# Patient Record
Sex: Female | Born: 1950
Health system: Southern US, Community
[De-identification: ages and names within clinical notes are randomized; demographics above are authoritative.]

## PROBLEM LIST (undated history)

## (undated) DIAGNOSIS — Z8711 Personal history of peptic ulcer disease: Secondary | ICD-10-CM

## (undated) DIAGNOSIS — G43909 Migraine, unspecified, not intractable, without status migrainosus: Secondary | ICD-10-CM

## (undated) DIAGNOSIS — H3530 Unspecified macular degeneration: Secondary | ICD-10-CM

## (undated) DIAGNOSIS — M1990 Unspecified osteoarthritis, unspecified site: Secondary | ICD-10-CM

## (undated) DIAGNOSIS — K589 Irritable bowel syndrome without diarrhea: Secondary | ICD-10-CM

## (undated) DIAGNOSIS — Z8719 Personal history of other diseases of the digestive system: Secondary | ICD-10-CM

## (undated) DIAGNOSIS — D649 Anemia, unspecified: Secondary | ICD-10-CM

## (undated) DIAGNOSIS — R32 Unspecified urinary incontinence: Secondary | ICD-10-CM

## (undated) DIAGNOSIS — B019 Varicella without complication: Secondary | ICD-10-CM

## (undated) DIAGNOSIS — I10 Essential (primary) hypertension: Secondary | ICD-10-CM

## (undated) DIAGNOSIS — H269 Unspecified cataract: Secondary | ICD-10-CM

## (undated) DIAGNOSIS — C801 Malignant (primary) neoplasm, unspecified: Secondary | ICD-10-CM

## (undated) DIAGNOSIS — J189 Pneumonia, unspecified organism: Secondary | ICD-10-CM

## (undated) DIAGNOSIS — K219 Gastro-esophageal reflux disease without esophagitis: Secondary | ICD-10-CM

## (undated) DIAGNOSIS — G4730 Sleep apnea, unspecified: Secondary | ICD-10-CM

## (undated) DIAGNOSIS — E785 Hyperlipidemia, unspecified: Secondary | ICD-10-CM

## (undated) DIAGNOSIS — T7840XA Allergy, unspecified, initial encounter: Secondary | ICD-10-CM

## (undated) HISTORY — DX: Gastro-esophageal reflux disease without esophagitis: K21.9

## (undated) HISTORY — DX: Unspecified osteoarthritis, unspecified site: M19.90

## (undated) HISTORY — DX: Personal history of other diseases of the digestive system: Z87.19

## (undated) HISTORY — DX: Personal history of peptic ulcer disease: Z87.11

## (undated) HISTORY — PX: BUNIONECTOMY: SHX129

## (undated) HISTORY — DX: Essential (primary) hypertension: I10

## (undated) HISTORY — DX: Allergy, unspecified, initial encounter: T78.40XA

## (undated) HISTORY — DX: Varicella without complication: B01.9

## (undated) HISTORY — DX: Sleep apnea, unspecified: G47.30

## (undated) HISTORY — PX: ABDOMINAL HYSTERECTOMY: SHX81

## (undated) HISTORY — DX: Hyperlipidemia, unspecified: E78.5

## (undated) HISTORY — DX: Unspecified urinary incontinence: R32

## (undated) HISTORY — PX: EYE SURGERY: SHX253

## (undated) HISTORY — PX: OTHER SURGICAL HISTORY: SHX169

## (undated) HISTORY — DX: Unspecified cataract: H26.9

## (undated) HISTORY — DX: Pneumonia, unspecified organism: J18.9

## (undated) HISTORY — DX: Irritable bowel syndrome, unspecified: K58.9

## (undated) HISTORY — DX: Unspecified macular degeneration: H35.30

## (undated) HISTORY — DX: Anemia, unspecified: D64.9

## (undated) HISTORY — PX: SHOULDER SURGERY: SHX246

## (undated) HISTORY — DX: Migraine, unspecified, not intractable, without status migrainosus: G43.909

## (undated) HISTORY — DX: Malignant (primary) neoplasm, unspecified: C80.1

---
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
## 1982-10-24 HISTORY — PX: DILATION AND CURETTAGE OF UTERUS: SHX78

## 1983-10-25 HISTORY — PX: APPENDECTOMY: SHX54

## 2002-10-24 HISTORY — PX: EYE SURGERY: SHX253

## 2010-04-20 DIAGNOSIS — M129 Arthropathy, unspecified: Secondary | ICD-10-CM | POA: Insufficient documentation

## 2010-04-20 DIAGNOSIS — E785 Hyperlipidemia, unspecified: Secondary | ICD-10-CM | POA: Insufficient documentation

## 2010-04-20 DIAGNOSIS — D649 Anemia, unspecified: Secondary | ICD-10-CM

## 2010-04-20 DIAGNOSIS — R002 Palpitations: Secondary | ICD-10-CM

## 2010-05-05 ENCOUNTER — Ambulatory Visit: Payer: Self-pay | Admitting: Cardiology

## 2010-05-05 DIAGNOSIS — I10 Essential (primary) hypertension: Secondary | ICD-10-CM | POA: Insufficient documentation

## 2010-06-14 ENCOUNTER — Ambulatory Visit: Payer: Self-pay | Admitting: Cardiology

## 2010-06-14 ENCOUNTER — Ambulatory Visit (HOSPITAL_COMMUNITY): Admission: RE | Admit: 2010-06-14 | Discharge: 2010-06-14 | Payer: Self-pay | Admitting: Cardiology

## 2010-06-14 ENCOUNTER — Ambulatory Visit: Payer: Self-pay | Admitting: Internal Medicine

## 2010-06-14 ENCOUNTER — Encounter: Payer: Self-pay | Admitting: Cardiology

## 2010-06-14 ENCOUNTER — Ambulatory Visit: Payer: Self-pay

## 2010-06-15 LAB — CONVERTED CEMR LAB
ALT: 28 units/L (ref 0–35)
Alkaline Phosphatase: 79 units/L (ref 39–117)
Bilirubin, Direct: 0.2 mg/dL (ref 0.0–0.3)
Cholesterol: 128 mg/dL (ref 0–200)
Total Protein: 7 g/dL (ref 6.0–8.3)

## 2010-08-18 ENCOUNTER — Ambulatory Visit: Payer: Self-pay | Admitting: Cardiology

## 2010-11-23 NOTE — Assessment & Plan Note (Signed)
Summary: f19m/dfg   Visit Type:  3 mo f/u Referring Provider:  Zelphia Cairo Primary Provider:  Caryl Asp  CC:  sob w/inclines...edema when it is hot out....denies any other complaints today.  History of Present Illness: Brianna Farrell comes in today for followup. Her echocardiogram was essentially normal. Review that with her today. She had an excellent response to Lipitor. Her a shielded drop however her LDL is below 70 and her total cholesterol ratio ratio is 4. LFTs were normal. She is tolerating the Lipitor well.  Current Medications (verified): 1)  Aspirin 81 Mg Tbec (Aspirin) .... Take One Tablet By Mouth Daily 2)  Omega-3 Krill Oil 300 Mg Caps (Krill Oil) .Marland Kitchen.. 1 Cap Once Daily 3)  Coral Calcium Plus  Caps (Multiple Vitamins-Minerals) .Marland Kitchen.. 1 Cap Once Daily 4)  Omeprazole 20 Mg Cpdr (Omeprazole) .Marland Kitchen.. 1 Tab Once Daily 5)  Multivitamins   Tabs (Multiple Vitamin) .Marland Kitchen.. 1 Tab Once Daily 6)  Tylenol Allergy Multi-Symptom  Misc (Chlorphen-Diphenhyd-Pe-Apap) .... As Needed 7)  Lipitor 20 Mg Tabs (Atorvastatin Calcium) .... Take 1 Tablet Every Evening  Allergies: 1)  ! Nsaids 2)  ! Ibuprofen  Past History:  Past Medical History: Last updated: 04/20/2010 anemia arthritis palpatations hyperlipidemia  Past Surgical History: Last updated: 04/20/2010 Tonsillectomy Hysterectomy Laser eye surgery Abdominal laprascopic exploratory surgery  Family History: Last updated: 04/20/2010 Family History of Coronary Artery Disease:  Fx H/O respitory disease/asthma Fx H/O hiatal hernia/peptic ulcer Family History of Renal Disease:  Fx H/O gallbladder disease Fx H/O osteoporosis Fx H/O diverticulosis  Social History: Last updated: 04/20/2010 Married  Tobacco Use - No.  Alcohol Use - no Drug Use - no  Risk Factors: Smoking Status: never (04/20/2010)  Vital Signs:  Patient profile:   60 year old female Height:      63 inches Weight:      169.12 pounds BMI:     30.07 Pulse  rate:   76 / minute Pulse rhythm:   irregular BP sitting:   128 / 90  (left arm) Cuff size:   large  Vitals Entered By: Danielle Rankin, CMA (August 18, 2010 3:37 PM)   Impression & Recommendations:  Problem # 1:  HYPERLIPIDEMIA (ICD-272.4) Assessment Improved we'll not change Lipitor. If it does not go generic by in December, we may switch to simvastatin. She is happy with her results and will stay with Lipitor. Her updated medication list for this problem includes:    Lipitor 20 Mg Tabs (Atorvastatin calcium) .Marland Kitchen... Take 1 tablet every evening  Problem # 2:  PALPITATIONS (ICD-785.1) reassurance with normal echo Her updated medication list for this problem includes:    Aspirin 81 Mg Tbec (Aspirin) .Marland Kitchen... Take one tablet by mouth daily  Problem # 3:  HYPERTENSION, BENIGN (ICD-401.1)  Her updated medication list for this problem includes:    Aspirin 81 Mg Tbec (Aspirin) .Marland Kitchen... Take one tablet by mouth daily  Problem # 4:  CORONARY ARTERY DISEASE, FAMILY HX (ICD-V17.3) Assessment: Unchanged  Patient Instructions: 1)  Your physician recommends that you schedule a follow-up appointment in: 1 year with Dr. [name] 2)  Your physician recommends that you continue on your current medications as directed. Please refer to the Current Medication list given to you today.

## 2010-11-23 NOTE — Assessment & Plan Note (Signed)
Summary: NP6/CHOLESTROL MANAGEMENT   Visit Type:  new pt visit Referring Provider:  Zelphia Cairo Primary Provider:  Caryl Asp  CC:  pt her mixed hyperlipidemia...palpatations....sob/stairs...edema/hands/feet but says she has always had this...denies any cp.  History of Present Illness: Brianna Farrell is a 60 year old white female referred by Dr. Renaldo Fiddler for mixed hyperlipidemia, other cardiac risk factors, and palpitations.  About 4 weeks ago, she developed a rapid heartbeat became unconscious slowly. It lasted all day. She had no chest pain or shortness of breath. She denies presyncope or syncope.  He went away gradually.  She had an event the other night after being woken up by an unhappy husband about 2 AM. Her heart was racing and she could not go back to sleep.  Is no cardiac history.  She's also had hyperlipidemia with a recent total cholesterol of 250, triglycerides of 206, HDL 43, LDL 166. TSH was normal.  Her other cardiac risk factors include possible new onset hypertension, obesity, sedentary lifestyle, and premature family history of coronary disease. She does not have diabetes and does not smoke.  She denies any exertional angina or ischemic symptoms.  Current Medications (verified): 1)  Aspirin Ec 325 Mg Tbec (Aspirin) .... Take One Tablet By Mouth Daily 2)  Omega-3 Krill Oil 300 Mg Caps (Krill Oil) .Marland Kitchen.. 1 Cap Once Daily 3)  Coral Calcium Plus  Caps (Multiple Vitamins-Minerals) .Marland Kitchen.. 1 Cap Once Daily 4)  Omeprazole 20 Mg Cpdr (Omeprazole) .Marland Kitchen.. 1 Tab Once Daily 5)  Multivitamins   Tabs (Multiple Vitamin) .Marland Kitchen.. 1 Tab Once Daily 6)  Tylenol Allergy Multi-Symptom  Misc (Chlorphen-Diphenhyd-Pe-Apap) .... As Needed  Allergies (verified): 1)  ! Nsaids 2)  ! Ibuprofen  Past History:  Past Medical History: Last updated: 04/20/2010 anemia arthritis palpatations hyperlipidemia  Past Surgical History: Last updated: 04/20/2010 Tonsillectomy Hysterectomy Laser eye  surgery Abdominal laprascopic exploratory surgery  Family History: Last updated: 04/20/2010 Family History of Coronary Artery Disease:  Fx H/O respitory disease/asthma Fx H/O hiatal hernia/peptic ulcer Family History of Renal Disease:  Fx H/O gallbladder disease Fx H/O osteoporosis Fx H/O diverticulosis  Social History: Last updated: 04/20/2010 Married  Tobacco Use - No.  Alcohol Use - no Drug Use - no  Risk Factors: Smoking Status: never (04/20/2010)  Review of Systems       negative other than history of present illness  Vital Signs:  Patient profile:   60 year old female Height:      63 inches Weight:      173 pounds BMI:     30.76 Pulse rate:   75 / minute Pulse rhythm:   irregular BP sitting:   146 / 90  (left arm) Cuff size:   large  Vitals Entered By: Danielle Rankin, CMA (May 05, 2010 12:19 PM)  Physical Exam  General:  obese.  no acute distress Head:  normocephalic and atraumatic Eyes:  PERRLA/EOM intact; conjunctiva and lids normal. Neck:  Neck supple, no JVD. No masses, thyromegaly or abnormal cervical nodes. Chest Rylin Seavey:  no deformities or breast masses noted Lungs:  Clear bilaterally to auscultation and percussion. Heart:  Non-displaced PMI, chest non-tender; regular rate and rhythm, S1, S2 without murmurs, rubs or gallops. Carotid upstroke normal, no bruit. Normal abdominal aortic size, no bruits. Femorals normal pulses, no bruits. Pedals normal pulses. No edema, no varicosities. Abdomen:  Bowel sounds positive; abdomen soft and non-tender without masses, organomegaly, or hernias noted. No hepatosplenomegaly. Msk:  Back normal, normal gait. Muscle strength and tone normal. Pulses:  pulses normal in all 4 extremities Extremities:  No clubbing or cyanosis. Neurologic:  Alert and oriented x 3. Skin:  Intact without lesions or rashes. Psych:  Normal affect.   Problems:  Medical Problems Added: 1)  Dx of Hypertension, Benign   (ICD-401.1)  EKG  Procedure date:  05/05/2010  Findings:      normal sinus rhythm, possible left atrial enlargement, poor R-wave progression  Impression & Recommendations:  Problem # 1:  HYPERLIPIDEMIA (ICD-272.4) Assessment Deteriorated I have had a long talk with the patient today. She is at moderate risk of a premature vascular event. I recommended Lipitor 40 mg a day with followup blood work in 6 weeks. I recommended weight loss, low carb diet, and regular exercise 3 hours or more week. Salt restriction and weight loss hopefully will normalize her blood pressure. If not this will have to be treated as well when I see her back in 4-6 weeks. Her updated medication list for this problem includes:    Lipitor 20 Mg Tabs (Atorvastatin calcium) .Marland Kitchen... Take 1 tablet every evening  Orders: Echocardiogram (Echo)  Problem # 2:  PALPITATIONS (ICD-785.1) Assessment: New I suspect these are benign. Will obtain 2-D echocardiogram to rule out any structural heart disease. Her updated medication list for this problem includes:    Aspirin Ec 325 Mg Tbec (Aspirin) .Marland Kitchen... Take one tablet by mouth daily  Problem # 3:  CORONARY ARTERY DISEASE, FAMILY HX (ICD-V17.3) Assessment: Unchanged  Orders: EKG w/ Interpretation (93000) Echocardiogram (Echo)  Problem # 4:  ANEMIA (ICD-285.9) Assessment: New She is clearly iron deficient. Hemoccults were negative x3. She does not normalize her hematocrit after 3 months of iron, I advised a colonoscopy.  Problem # 5:  HYPERTENSION, BENIGN (ICD-401.1) Assessment: New weight loss and salt restriction. We will recheck her pressure when she returns in 4-6 weeks. Her updated medication list for this problem includes:    Aspirin Ec 325 Mg Tbec (Aspirin) .Marland Kitchen... Take one tablet by mouth daily  Patient Instructions: 1)  Your physician recommends that you schedule a follow-up appointment in: 3 months with Dr. [name] 2)  Your physician recommends that you return for a  FASTING lipid profile and liver function test 272.4,278.0,785.1 IN 6 WEEKS 3)  Your physician has requested that you have an echocardiogram IN 6 WEEKS.  Echocardiography is a painless test that uses sound waves to create images of your heart. It provides your doctor with information about the size and shape of your heart and how well your heart's chambers and valves are working.  This procedure takes approximately one hour. There are no restrictions for this procedure. 4)  Your physician has requested that you limit the intake of sodium (salt) in your diet to two grams daily. Please see [facility] handout. 5)  Your physician encouraged you to lose weight for better health. 6)  Exercise 3 hours per week. Reduce carbohydrate intake Prescriptions: LIPITOR 20 MG TABS (ATORVASTATIN CALCIUM) Take 1 tablet every evening  #30 x 11   Entered by:   Lisabeth Devoid RN   Authorized by:   Gaylord Shih, MD, [facility]   Signed by:   Lisabeth Devoid RN on 05/05/2010   Method used:   Electronically to        [facility]* (retail)       [address]       [place], [place]  [address]       Ph: [phone]       Fax: [phone]  RxID:   [id number]

## 2011-05-02 ENCOUNTER — Other Ambulatory Visit: Payer: Self-pay | Admitting: *Deleted

## 2011-05-02 MED ORDER — ATORVASTATIN CALCIUM 20 MG PO TABS: 20.0000 mg | ORAL_TABLET | Freq: Every day | ORAL | Status: DC

## 2011-12-16 ENCOUNTER — Other Ambulatory Visit: Payer: Self-pay | Admitting: *Deleted

## 2011-12-16 MED ORDER — ATORVASTATIN CALCIUM 20 MG PO TABS: 20.0000 mg | ORAL_TABLET | Freq: Every day | ORAL | Status: AC

## 2013-10-24 HISTORY — PX: COLONOSCOPY: SHX174

## 2013-11-26 DIAGNOSIS — H5210 Myopia, unspecified eye: Secondary | ICD-10-CM | POA: Diagnosis not present

## 2013-11-26 DIAGNOSIS — H2510 Age-related nuclear cataract, unspecified eye: Secondary | ICD-10-CM | POA: Diagnosis not present

## 2013-11-26 DIAGNOSIS — H33309 Unspecified retinal break, unspecified eye: Secondary | ICD-10-CM | POA: Diagnosis not present

## 2013-11-26 DIAGNOSIS — H353190 Nonexudative age-related macular degeneration, unspecified eye, stage unspecified: Secondary | ICD-10-CM | POA: Diagnosis not present

## 2014-03-13 DIAGNOSIS — R6889 Other general symptoms and signs: Secondary | ICD-10-CM | POA: Diagnosis not present

## 2014-03-13 DIAGNOSIS — E7800 Pure hypercholesterolemia, unspecified: Secondary | ICD-10-CM | POA: Diagnosis not present

## 2014-03-20 ENCOUNTER — Telehealth: Payer: Self-pay | Admitting: *Deleted

## 2014-03-20 NOTE — Telephone Encounter (Signed)
Received office notes from [facility] via fax for patient. Patient has a new patient appointment scheduled for April 01, 2014 with Dr.Lowne. All notes forwarded to Dr. Nonda Lou CMA. JG//CMA

## 2014-03-31 ENCOUNTER — Telehealth: Payer: Self-pay

## 2014-03-31 NOTE — Telephone Encounter (Signed)
Left message for call back Non-identifiable  New patient 

## 2014-04-01 ENCOUNTER — Other Ambulatory Visit: Payer: Self-pay | Admitting: Family Medicine

## 2014-04-01 ENCOUNTER — Encounter: Payer: Self-pay | Admitting: Family Medicine

## 2014-04-01 ENCOUNTER — Ambulatory Visit (INDEPENDENT_AMBULATORY_CARE_PROVIDER_SITE_OTHER): Payer: Medicare Other | Admitting: Family Medicine

## 2014-04-01 VITALS — BP 134/74 | HR 68 | Temp 98.6°F

## 2014-04-01 DIAGNOSIS — K219 Gastro-esophageal reflux disease without esophagitis: Secondary | ICD-10-CM | POA: Diagnosis not present

## 2014-04-01 DIAGNOSIS — K589 Irritable bowel syndrome without diarrhea: Secondary | ICD-10-CM | POA: Diagnosis not present

## 2014-04-01 DIAGNOSIS — R197 Diarrhea, unspecified: Secondary | ICD-10-CM

## 2014-04-01 DIAGNOSIS — E785 Hyperlipidemia, unspecified: Secondary | ICD-10-CM

## 2014-04-01 MED ORDER — HYOSCYAMINE SULFATE 0.125 MG SL SUBL: 0.1250 mg | SUBLINGUAL_TABLET | SUBLINGUAL | Status: DC | PRN

## 2014-04-01 MED ORDER — PANTOPRAZOLE SODIUM 40 MG PO TBEC: 40.0000 mg | DELAYED_RELEASE_TABLET | Freq: Every day | ORAL | Status: DC

## 2014-04-01 NOTE — Progress Notes (Signed)
   Subjective:    Patient ID: Brianna Farrell, female    DOB: Oct 01, 1951, 63 y.o.   MRN: [id number]  HPI    Review of Systems     Objective:   Physical Exam BP 134/74  Pulse 68  Temp(Src) 98.6 F (37 C) (Oral)  SpO2 96% General appearance: alert, cooperative, appears stated age and no distress Neck: no adenopathy, no carotid bruit, no JVD, supple, symmetrical, trachea midline and thyroid not enlarged, symmetric, no tenderness/mass/nodules Lungs: clear to auscultation bilaterally Heart: regular rate and rhythm, S1, S2 normal, no murmur, click, rub or gallop Abdomen: soft, non-tender; bowel sounds normal; no masses,  no organomegaly Extremities: extremities normal, atraumatic, no cyanosis or edema        Assessment & Plan:  1. IBS (irritable bowel syndrome) suspected - hyoscyamine (LEVSIN/SL) 0.125 MG SL tablet; Place 1 tablet (0.125 mg total) under the tongue every 4 (four) hours as needed.  Dispense: 30 tablet; Refill: 0 - Ambulatory referral to Gastroenterology  2. Diarrhea   - Clostridium difficile EIA - CBC with Differential - Basic metabolic panel - Hepatic function panel - POCT urinalysis dipstick - TSH  3. Other and unspecified hyperlipidemia Check labs - Basic metabolic panel; Future - Hepatic function panel; Future - Lipid panel; Future  4. GERD (gastroesophageal reflux disease) Check labs, protonix - H. pylori antibody, IgG - Ambulatory referral to Gastroenterology

## 2014-04-01 NOTE — Patient Instructions (Signed)
Diarrhea Diarrhea is frequent loose and watery bowel movements. It can cause you to feel weak and dehydrated. Dehydration can cause you to become tired and thirsty, have a dry mouth, and have decreased urination that often is dark yellow. Diarrhea is a sign of another problem, most often an infection that will not last long. In most cases, diarrhea typically lasts 2 3 days. However, it can last longer if it is a sign of something more serious. It is important to treat your diarrhea as directed by your caregive to lessen or prevent future episodes of diarrhea. CAUSES  Some common causes include:  Gastrointestinal infections caused by viruses, bacteria, or parasites.  Food poisoning or food allergies.  Certain medicines, such as antibiotics, chemotherapy, and laxatives.  Artificial sweeteners and fructose.  Digestive disorders. HOME CARE INSTRUCTIONS  Ensure adequate fluid intake (hydration): have 1 cup (8 oz) of fluid for each diarrhea episode. Avoid fluids that contain simple sugars or sports drinks, fruit juices, whole milk products, and sodas. Your urine should be clear or pale yellow if you are drinking enough fluids. Hydrate with an oral rehydration solution that you can purchase at pharmacies, retail stores, and online. You can prepare an oral rehydration solution at home by mixing the following ingredients together:    tsp table salt.      tsp baking soda.   tsp salt substitute containing potassium chloride.  1  tablespoons sugar.  1 L (34 oz) of water.  Certain foods and beverages may increase the speed at which food moves through the gastrointestinal (GI) tract. These foods and beverages should be avoided and include:  Caffeinated and alcoholic beverages.  High-fiber foods, such as raw fruits and vegetables, nuts, seeds, and whole grain breads and cereals.  Foods and beverages sweetened with sugar alcohols, such as xylitol, sorbitol, and mannitol.  Some foods may be  well tolerated and may help thicken stool including:  Starchy foods, such as rice, toast, pasta, low-sugar cereal, oatmeal, grits, baked potatoes, crackers, and bagels.  Bananas.  Applesauce.  Add probiotic-rich foods to help increase healthy bacteria in the GI tract, such as yogurt and fermented milk products.  Wash your hands well after each diarrhea episode.  Only take over-the-counter or prescription medicines as directed by your caregiver.  Take a warm bath to relieve any burning or pain from frequent diarrhea episodes. SEEK IMMEDIATE MEDICAL CARE IF:   You are unable to keep fluids down.  You have persistent vomiting.  You have blood in your stool, or your stools are black and tarry.  You do not urinate in 6 8 hours, or there is only a small amount of very dark urine.  You have abdominal pain that increases or localizes.  You have weakness, dizziness, confusion, or lightheadedness.  You have a severe headache.  Your diarrhea gets worse or does not get better.  You have a fever or persistent symptoms for more than 2 3 days.  You have a fever and your symptoms suddenly get worse. MAKE SURE YOU:   Understand these instructions.  Will watch your condition.  Will get help right away if you are not doing well or get worse. Document Released: 09/30/2002 Document Revised: 09/26/2012 Document Reviewed: 06/17/2012 [facility] Patient Information 2014 [place], [place].  Triglycerides, TG, TRIG This is a test to check your risk of developing heart disease. It is often done as part of a lipid profile during a regular medical exam or if you are being treated for high triglycerides.  This test measures the amount of triglycerides in your blood. Triglycerides are the body's storage form for fat. Most triglycerides are found in fat tissue. Some triglycerides circulate in the blood to provide fuel for muscles to work. Extra triglycerides are found in the blood after eating a meal when  fat is being sent from the gut to fat tissue for storage. The test for triglycerides should be done when you are fasting and no extra triglycerides from a recent meal are present.  SAMPLE COLLECTION The test for triglycerides uses a blood sample. Most often, the blood sample is collected using a needle to collect blood from a vein. Sometimes triglycerides are measured using a drop of blood collected by puncturing the skin on a finger. Testing should be done when you are fasting. For 12 to 14 hours before the test, only water is permitted. In addition, alcohol should not be consumed for the 24 hours just before the test. If you are diabetic and your blood sugar is out of control, triglycerides will be very high. NORMAL FINDINGS  Adult/elderly  Female: 40-160 mg/dL or 0.45-1.81 mmol/L (SI units)  Female: 35-135 mg/dL or 0.40-1.52 mmol/L (SI units)  0-65 years  Female: 30-86 mg/dL  Female: 32-99 mg/dL  6-65 years  Female: 31-108 mg/dL  Female: 35-114 mg/dL  12-65 years  Female: 36-138 mg/dL  Female: 41-138 mg/dL  16-65 years  Female: 40-163 mg/dL  Female: 40-128 mg/dL Ranges for normal findings may vary among different laboratories and hospitals. You should always check with your doctor after having lab work or other tests done to discuss the meaning of your test results and whether your values are considered within normal limits. MEANING OF TEST  Your caregiver will go over the test results with you and discuss the importance and meaning of your results, as well as treatment options and the need for additional tests if necessary. OBTAINING THE TEST RESULTS It is your responsibility to obtain your test results. Ask the lab or department performing the test when and how you will get your results. Document Released: 11/12/2004 Document Revised: 01/02/2012 Document Reviewed: 09/21/2008 [facility] Patient Information 2014 [place], [place].

## 2014-04-01 NOTE — Progress Notes (Signed)
Pre visit review using our clinic review tool, if applicable. No additional management support is needed unless otherwise documented below in the visit note. 

## 2014-04-02 LAB — BASIC METABOLIC PANEL
BUN: 14 mg/dL (ref 6–23)
CO2: 26 mEq/L (ref 19–32)
CREATININE: 1 mg/dL (ref 0.4–1.2)
Calcium: 9.7 mg/dL (ref 8.4–10.5)
Chloride: 105 mEq/L (ref 96–112)
GFR: 61.92 mL/min (ref >60.00)
Glucose, Bld: 113 mg/dL — ABNORMAL HIGH (ref 70–99)
Potassium: 3.9 mEq/L (ref 3.5–5.1)
Sodium: 139 mEq/L (ref 135–145)

## 2014-04-02 LAB — POCT URINALYSIS DIPSTICK
BILIRUBIN UA: NEGATIVE
Blood, UA: NEGATIVE
Glucose, UA: NEGATIVE
Ketones, UA: NEGATIVE
LEUKOCYTES UA: NEGATIVE
NITRITE UA: NEGATIVE
PROTEIN UA: NEGATIVE
Spec Grav, UA: >=1.03
Urobilinogen, UA: 0.2
pH, UA: 6

## 2014-04-02 LAB — CBC WITH DIFFERENTIAL/PLATELET
Basophils Absolute: 0 10*3/uL (ref 0.0–0.1)
Basophils Relative: 0.4 % (ref 0.0–3.0)
EOS ABS: 0.2 10*3/uL (ref 0.0–0.7)
Eosinophils Relative: 2.6 % (ref 0.0–5.0)
HEMATOCRIT: 41.7 % (ref 36.0–46.0)
Hemoglobin: 14.1 g/dL (ref 12.0–15.0)
Lymphocytes Relative: 43.9 % (ref 12.0–46.0)
Lymphs Abs: 2.9 10*3/uL (ref 0.7–4.0)
MCHC: 33.9 g/dL (ref 30.0–36.0)
MCV: 88.6 fl (ref 78.0–100.0)
MONO ABS: 0.5 10*3/uL (ref 0.1–1.0)
Monocytes Relative: 7.3 % (ref 3.0–12.0)
Neutro Abs: 3.1 10*3/uL (ref 1.4–7.7)
Neutrophils Relative %: 45.8 % (ref 43.0–77.0)
Platelets: 370 10*3/uL (ref 150.0–400.0)
RBC: 4.71 Mil/uL (ref 3.87–5.11)
RDW: 12.7 % (ref 11.5–15.5)
WBC: 6.7 10*3/uL (ref 4.0–10.5)

## 2014-04-02 LAB — HEPATIC FUNCTION PANEL
ALBUMIN: 4.2 g/dL (ref 3.5–5.2)
ALT: 25 U/L (ref 0–35)
AST: 26 U/L (ref 0–37)
Alkaline Phosphatase: 63 U/L (ref 39–117)
BILIRUBIN TOTAL: 1.5 mg/dL — AB (ref 0.2–1.2)
Bilirubin, Direct: 0.1 mg/dL (ref 0.0–0.3)
Total Protein: 6.9 g/dL (ref 6.0–8.3)

## 2014-04-02 LAB — H. PYLORI ANTIBODY, IGG: H Pylori IgG: NEGATIVE

## 2014-04-02 LAB — TSH: TSH: 1.46 u[IU]/mL (ref 0.35–4.50)

## 2014-04-02 NOTE — Telephone Encounter (Signed)
Unable to reach patient pre visit.  

## 2014-04-03 NOTE — Progress Notes (Signed)
   Subjective:    Patient ID: Brianna Farrell, female    DOB: 1951/03/31, 63 y.o.   MRN: [id number]  HPI Pt is here to establish and to go over labs from gyn.  Her TG were slightly elevated.  Pt then c/o issue with daily diarrhea and abd cramping.  No blood seen.  No NV, no fever No cp, no sob   Review of Systems As above.    Past Medical History  Diagnosis Date  . Arthritis   . Chicken pox   . GERD (gastroesophageal reflux disease)   . History of stomach ulcers   . HTN (hypertension)   . Hyperlipidemia   . Migraines   . Urine incontinence   . Macular degeneration    History   Social History  . Marital Status: Widowed    Spouse Name: N/A    Number of Children: N/A  . Years of Education: N/A   Occupational History  . Not on file.   Social History Main Topics  . Smoking status: Never Smoker   . Smokeless tobacco: Never Used  . Alcohol Use: No  . Drug Use: No  . Sexual Activity: Not on file   Other Topics Concern  . Not on file   Social History Narrative  . No narrative on file   Family History  Problem Relation Age of Onset  . Arthritis Mother   . Osteoarthritis Mother   . Rheum arthritis Mother   . Ovarian cancer      X's 2 cousins  . Breast cancer      Both sides-- Aunts  . Hyperlipidemia Father   . Hyperlipidemia Brother   . Heart disease Father   . Heart disease Brother   . Stroke Paternal Aunt   . Kidney disease Mother    Current Outpatient Prescriptions  Medication Sig Dispense Refill  . acetaminophen (TYLENOL) 500 MG tablet Take 500 mg by mouth every 6 (six) hours as needed.      . cetirizine (ZYRTEC) 10 MG tablet Take 5 mg by mouth daily.      Marland Kitchen loperamide (IMODIUM A-D) 2 MG tablet Take 2 mg by mouth 4 (four) times daily as needed for diarrhea or loose stools.      Marland Kitchen tetrahydrozoline-zinc (VISINE-AC) 0.05-0.25 % ophthalmic solution 2 drops 3 (three) times daily as needed.      . hyoscyamine (LEVSIN/SL) 0.125 MG SL tablet Place 1 tablet (0.125 mg  total) under the tongue every 4 (four) hours as needed.  30 tablet  0  . pantoprazole (PROTONIX) 40 MG tablet Take 1 tablet (40 mg total) by mouth daily.  90 tablet  3   No current facility-administered medications for this visit.   Allergies  Allergen Reactions  . Ibuprofen Anaphylaxis    REACTION: hives, tongue edema  . Nsaids Anaphylaxis    REACTION: hives, tongue edema   Past Surgical History  Procedure Laterality Date  . Appendectomy  1985  . Tonsillectomy and adenoidectomy    . Abdominal hysterectomy      Still has Ovaries  . Dilation and curettage of uterus    . Shoulder surgery Right     Shoulder Manipulation    Objective:   Physical Exam        Assessment & Plan:

## 2014-04-08 ENCOUNTER — Encounter: Payer: Self-pay | Admitting: Internal Medicine

## 2014-04-08 ENCOUNTER — Encounter: Payer: Self-pay | Admitting: *Deleted

## 2014-04-08 ENCOUNTER — Encounter: Payer: Self-pay | Admitting: Obstetrics and Gynecology

## 2014-04-16 LAB — C. DIFFICILE GDH AND TOXIN A/B
C. DIFF TOXIN A/B: NOT DETECTED
C. DIFFICILE GDH: NOT DETECTED

## 2014-05-02 ENCOUNTER — Ambulatory Visit (INDEPENDENT_AMBULATORY_CARE_PROVIDER_SITE_OTHER): Payer: Medicare Other | Admitting: Internal Medicine

## 2014-05-02 ENCOUNTER — Encounter: Payer: Self-pay | Admitting: Internal Medicine

## 2014-05-02 VITALS — BP 146/78 | HR 76 | Ht 63.75 in | Wt 176.0 lb

## 2014-05-02 DIAGNOSIS — R198 Other specified symptoms and signs involving the digestive system and abdomen: Secondary | ICD-10-CM

## 2014-05-02 DIAGNOSIS — Z1211 Encounter for screening for malignant neoplasm of colon: Secondary | ICD-10-CM | POA: Diagnosis not present

## 2014-05-02 MED ORDER — MOVIPREP 100 G PO SOLR: 1.0000 | Freq: Once | ORAL | Status: DC

## 2014-05-02 NOTE — Progress Notes (Signed)
Brianna Farrell [birth year] [id number]  Note: This dictation was prepared with Dragon digital system. Any transcriptional errors that result from this procedure are unintentional.   History of Present Illness:  This is a 63 year old white female who was referred by [facility] for evaluation of crampy abdominal pain, bloating and dyspepsia which has been going on for over a year. Pt's mother and her husband passed away last year and she was under a lot of stress. She has  a history of irritable bowel syndrome with predominant diarrhea. Since 1990ies. Her recent stool studies for pathogens were negative. Her H. pylori antibody was also negative. She has been  switched from omeprazole to Protonix 40 mg a day by Dr Etter Sjogren  with complete resolution of her GI symptoms. She no longer has diarrhea or dyspepsia. She had a colonoscopy more than 10 years ago which was normal. There is no family history of colon cancer or inflammatory bowel disease. Her mother had gangrenous gallbladder. Pt. mild elevation of total bilirubin 1.5, indirect 1.3., c/w Gilbert syndrome.    Past Medical History  Diagnosis Date  . Arthritis   . Chicken pox   . GERD (gastroesophageal reflux disease)   . History of stomach ulcers   . HTN (hypertension)   . Hyperlipidemia   . Migraines   . Urine incontinence   . Macular degeneration   . IBS (irritable bowel syndrome)   . Anemia   . Pneumonia     Past Surgical History  Procedure Laterality Date  . Appendectomy  1985  . Tonsillectomy and adenoidectomy  1953  . Abdominal hysterectomy      Still has Ovaries  . Dilation and curettage of uterus  1984  . Shoulder surgery Right     Shoulder Manipulation    Allergies  Allergen Reactions  . Ibuprofen Anaphylaxis    REACTION: hives, tongue edema  . Nsaids Anaphylaxis    REACTION: hives, tongue edema    Family history and social history have been reviewed.  Review of Systems: Occasional dysphagia to solids. Occasional  heartburn areas denies rectal bleeding he said Hemoccult cards were negative  The remainder of the 10 point ROS is negative except as outlined in the H&P  Physical Exam: General Appearance Well developed, in no distress, mildly overweight  Eyes  Non icteric  HEENT  Non traumatic, normocephalic  Mouth No lesion, tongue papillated, no cheilosis Neck Supple without adenopathy, thyroid not enlarged, no carotid bruits, no JVD Lungs Clear to auscultation bilaterally COR Normal S1, normal S2, regular rhythm, no murmur, quiet precordium Abdomen Soft minimally tender in epigastrium. Normal active bowel sounds. Liver edge at costal margin. Lower abdomen unremarkable  Rectal Note done  Extremities  No pedal edema Skin No lesions Neurological Alert and oriented x 3 Psychological Normal mood and affect  Assessment and Plan:   Problem #53 63 year old white female with complete resolution of her dyspepsia and crampy abdominal pain. She attributes the improvement to Protonix 40 mg daily and Levsin sublingually 0.125 mg. She has strong family history of gallbladder disease in her mother so we will go forward with upper abdominal ultrasound. She is also due for a recall colonoscopy as the last exam was done more than 10 years ago and we don't have the report. I have asked her to stay on a low fat carbohydrate modified diet and try to lose some weight. She has several refills on her Protonix.    Delfin Edis 05/02/2014

## 2014-05-02 NOTE — Patient Instructions (Addendum)
You have been scheduled for a colonoscopy. Please follow written instructions given to you at your visit today.  Please pick up your prep kit at the pharmacy within the next 1-3 days. If you use inhalers (even only as needed), please bring them with you on the day of your procedure. Your physician has requested that you go to www.startemmi.com and enter the access code given to you at your visit today. This web site gives a general overview about your procedure. However, you should still follow specific instructions given to you by our office regarding your preparation for the procedure.  You have been scheduled for an abdominal ultrasound at [facility] (1st floor of hospital) on 05/06/14 at 8:00 am. Please arrive 15 minutes prior to your appointment for registration. Make certain not to have anything to eat or drink 6 hours prior to your appointment. Should you need to reschedule your appointment, please contact radiology at [phone]. This test typically takes about 30 minutes to perform.

## 2014-05-06 ENCOUNTER — Telehealth: Payer: Self-pay | Admitting: Internal Medicine

## 2014-05-06 ENCOUNTER — Ambulatory Visit (HOSPITAL_COMMUNITY)
Admission: RE | Admit: 2014-05-06 | Discharge: 2014-05-06 | Disposition: A | Discharge status: Home / Self Care | Payer: Medicare Other | Source: Ambulatory Visit | Attending: Internal Medicine | Admitting: Internal Medicine

## 2014-05-06 DIAGNOSIS — Z1211 Encounter for screening for malignant neoplasm of colon: Secondary | ICD-10-CM

## 2014-05-06 DIAGNOSIS — K3189 Other diseases of stomach and duodenum: Secondary | ICD-10-CM | POA: Diagnosis not present

## 2014-05-06 DIAGNOSIS — K7689 Other specified diseases of liver: Secondary | ICD-10-CM | POA: Insufficient documentation

## 2014-05-06 DIAGNOSIS — R198 Other specified symptoms and signs involving the digestive system and abdomen: Secondary | ICD-10-CM

## 2014-05-06 DIAGNOSIS — R1013 Epigastric pain: Principal | ICD-10-CM

## 2014-05-06 NOTE — Telephone Encounter (Signed)
I have placed a free prep voucher in the mail for patient. She has been advised of this.

## 2014-05-20 ENCOUNTER — Other Ambulatory Visit (INDEPENDENT_AMBULATORY_CARE_PROVIDER_SITE_OTHER): Payer: Medicare Other

## 2014-05-20 DIAGNOSIS — Z0000 Encounter for general adult medical examination without abnormal findings: Secondary | ICD-10-CM | POA: Diagnosis not present

## 2014-05-20 LAB — POC HEMOCCULT BLD/STL (HOME/3-CARD/SCREEN)
Card #2 Fecal Occult Blod, POC: NEGATIVE
Card #3 Fecal Occult Blood, POC: NEGATIVE
Fecal Occult Blood, POC: NEGATIVE

## 2014-05-20 NOTE — Addendum Note (Signed)
Addended by: Modena Morrow D on: 05/20/2014 03:11 PM   Modules accepted: Orders

## 2014-06-11 ENCOUNTER — Telehealth: Payer: Self-pay | Admitting: Internal Medicine

## 2014-06-11 ENCOUNTER — Encounter: Payer: Self-pay | Admitting: Internal Medicine

## 2014-06-11 ENCOUNTER — Ambulatory Visit (AMBULATORY_SURGERY_CENTER): Payer: Medicare Other | Admitting: Internal Medicine

## 2014-06-11 VITALS — BP 148/80 | HR 83 | Temp 99.1°F | Resp 12 | Ht 63.0 in | Wt 176.0 lb

## 2014-06-11 DIAGNOSIS — Z1211 Encounter for screening for malignant neoplasm of colon: Secondary | ICD-10-CM

## 2014-06-11 DIAGNOSIS — I10 Essential (primary) hypertension: Secondary | ICD-10-CM | POA: Diagnosis not present

## 2014-06-11 DIAGNOSIS — R002 Palpitations: Secondary | ICD-10-CM | POA: Diagnosis not present

## 2014-06-11 MED ORDER — SODIUM CHLORIDE 0.9 % IV SOLN: 500.0000 mL | INTRAVENOUS | Status: DC

## 2014-06-11 NOTE — Telephone Encounter (Signed)
Patient concerned about the length of her travel and taking her prep at 11 am. Informed her that she could start it at 10 am if she desired and also reassured her that she would not have nearly as much volume from prep today. Patient understands and will start a bit earlier with prep for 3 pm arrival today.

## 2014-06-11 NOTE — Progress Notes (Signed)
Report to PACU, RN, vss, BBS= Clear.  

## 2014-06-11 NOTE — Patient Instructions (Signed)
YOU HAD AN ENDOSCOPIC PROCEDURE TODAY AT THE [facility]: Refer to the procedure report that was given to you for any specific questions about what was found during the examination.  If the procedure report does not answer your questions, please call your gastroenterologist to clarify.  If you requested that your care partner not be given the details of your procedure findings, then the procedure report has been included in a sealed envelope for you to review at your convenience later.  YOU SHOULD EXPECT: Some feelings of bloating in the abdomen. Passage of more gas than usual.  Walking can help get rid of the air that was put into your GI tract during the procedure and reduce the bloating. If you had a lower endoscopy (such as a colonoscopy or flexible sigmoidoscopy) you may notice spotting of blood in your stool or on the toilet paper. If you underwent a bowel prep for your procedure, then you may not have a normal bowel movement for a few days.  DIET: Your first meal following the procedure should be a light meal and then it is ok to progress to your normal diet.  A half-sandwich or bowl of soup is an example of a good first meal.  Heavy or fried foods are harder to digest and may make you feel nauseous or bloated.  Likewise meals heavy in dairy and vegetables can cause extra gas to form and this can also increase the bloating.  Drink plenty of fluids but you should avoid alcoholic beverages for 24 hours.  ACTIVITY: Your care partner should take you home directly after the procedure.  You should plan to take it easy, moving slowly for the rest of the day.  You can resume normal activity the day after the procedure however you should NOT DRIVE or use heavy machinery for 24 hours (because of the sedation medicines used during the test).    SYMPTOMS TO REPORT IMMEDIATELY: A gastroenterologist can be reached at any hour.  During normal business hours, 8:30 AM to 5:00 PM Monday through Friday,  call [phone].  After hours and on weekends, please call the GI answering service at [phone] who will take a message and have the physician on call contact you.   Following lower endoscopy (colonoscopy or flexible sigmoidoscopy):  Excessive amounts of blood in the stool  Significant tenderness or worsening of abdominal pains  Swelling of the abdomen that is new, acute  Fever of 100F or higher  FOLLOW UP: If any biopsies were taken you will be contacted by phone or by letter within the next 1-3 weeks.  Call your gastroenterologist if you have not heard about the biopsies in 3 weeks.  Our staff will call the home number listed on your records the next business day following your procedure to check on you and address any questions or concerns that you may have at that time regarding the information given to you following your procedure. This is a courtesy call and so if there is no answer at the home number and we have not heard from you through the emergency physician on call, we will assume that you have returned to your regular daily activities without incident.  SIGNATURES/CONFIDENTIALITY: You and/or your care partner have signed paperwork which will be entered into your electronic medical record.  These signatures attest to the fact that that the information above on your After Visit Summary has been reviewed and is understood.  Full responsibility of the confidentiality of this   discharge information lies with you and/or your care-partner.  Resume medications. Information given on diverticulosis and high fiber diet with discharge instructions. 

## 2014-06-11 NOTE — Op Note (Signed)
[place]  [address] [place], [address]   COLONOSCOPY PROCEDURE REPORT  PATIENT: Brianna Farrell, Brianna Farrell  MR#: [id number] BIRTHDATE: [birth year] , 37  yrs. old GENDER: Female ENDOSCOPIST: Lafayette Dragon, MD REFERRED IA:[id number] [place], DO PROCEDURE DATE:  06/11/2014 PROCEDURE:   Colonoscopy, screening First Screening Colonoscopy - Avg.  risk and is 50 yrs.  old or older Yes.  Prior Negative Screening - Now for repeat screening. N/A  History of Adenoma - Now for follow-up colonoscopy & has been > or = to 3 yrs.  N/A  Polyps Removed Today? No.  Recommend repeat exam, <10 yrs? No. ASA CLASS:   Class I INDICATIONS:Average risk patient for colon cancer. MEDICATIONS: MAC sedation, administered by CRNA and propofol (Diprivan) 300mg  IV  DESCRIPTION OF PROCEDURE:   After the risks benefits and alternatives of the procedure were thoroughly explained, informed consent was obtained.  A digital rectal exam revealed no abnormalities of the rectum.   The LB [id number]  endoscope was introduced through the anus and advanced to the cecum, which was identified by both the appendix and ileocecal valve. No adverse events experienced.   The quality of the prep was good, using MoviPrep  The instrument was then slowly withdrawn as the colon was fully examined.      COLON FINDINGS: There was moderate diverticulosis noted in the sigmoid colon with associated angulation, tortuosity and muscular hypertrophy.  Retroflexed views revealed no abnormalities. The time to cecum=7 minutes 25 seconds.  Withdrawal time=7 minutes 49 seconds.  The scope was withdrawn and the procedure completed. COMPLICATIONS: There were no complications.  ENDOSCOPIC IMPRESSION: There was moderate diverticulosis noted in the sigmoid colon  RECOMMENDATIONS: high fiber diet Fiber supplements daily recall colonoscopy in 10 years   eSigned:  Lafayette Dragon, MD 06/11/2014 4:50 PM   cc:   PATIENT NAME:   Brianna Farrell, Brianna Farrell MR#: [id number]

## 2014-06-12 ENCOUNTER — Telehealth: Payer: Self-pay

## 2014-06-12 NOTE — Telephone Encounter (Signed)
  Follow up Call-  Call back number 06/11/2014  Post procedure Call Back phone  # [phone]  Permission to leave phone message Yes     Patient questions:  Do you have a fever, pain , or abdominal swelling? No. Pain Score  0 *  Have you tolerated food without any problems? Yes.    Have you been able to return to your normal activities? Yes.    Do you have any questions about your discharge instructions: Diet   No. Medications  No. Follow up visit  No.  Do you have questions or concerns about your Care? No.  Actions: * If pain score is 4 or above: No action needed, pain <4.

## 2014-07-02 ENCOUNTER — Ambulatory Visit: Payer: Medicare Other | Admitting: Family Medicine

## 2014-07-03 ENCOUNTER — Encounter: Payer: Self-pay | Admitting: Family Medicine

## 2014-07-03 ENCOUNTER — Ambulatory Visit (INDEPENDENT_AMBULATORY_CARE_PROVIDER_SITE_OTHER): Payer: Medicare Other | Admitting: Family Medicine

## 2014-07-03 VITALS — BP 138/84 | HR 75 | Temp 98.5°F | Wt 174.6 lb

## 2014-07-03 DIAGNOSIS — J309 Allergic rhinitis, unspecified: Secondary | ICD-10-CM

## 2014-07-03 DIAGNOSIS — I10 Essential (primary) hypertension: Secondary | ICD-10-CM

## 2014-07-03 DIAGNOSIS — K219 Gastro-esophageal reflux disease without esophagitis: Secondary | ICD-10-CM | POA: Diagnosis not present

## 2014-07-03 DIAGNOSIS — J302 Other seasonal allergic rhinitis: Secondary | ICD-10-CM

## 2014-07-03 DIAGNOSIS — E785 Hyperlipidemia, unspecified: Secondary | ICD-10-CM

## 2014-07-03 MED ORDER — PREDNISONE 10 MG PO TABS: ORAL_TABLET | ORAL | Status: DC

## 2014-07-03 MED ORDER — PANTOPRAZOLE SODIUM 40 MG PO TBEC: 40.0000 mg | DELAYED_RELEASE_TABLET | Freq: Every day | ORAL | Status: DC

## 2014-07-03 MED ORDER — AZELASTINE HCL 0.1 % NA SOLN: 2.0000 | Freq: Two times a day (BID) | NASAL | Status: DC

## 2014-07-03 NOTE — Patient Instructions (Addendum)

## 2014-07-03 NOTE — Progress Notes (Signed)
Pre visit review using our clinic review tool, if applicable. No additional management support is needed unless otherwise documented below in the visit note. 

## 2014-07-04 LAB — LIPID PANEL
Cholesterol: 219 mg/dL — ABNORMAL HIGH (ref 0–200)
HDL: 34.4 mg/dL — AB (ref >39.00)
LDL Cholesterol: 148 mg/dL — ABNORMAL HIGH (ref 0–99)
NonHDL: 184.6
Total CHOL/HDL Ratio: 6
Triglycerides: 183 mg/dL — ABNORMAL HIGH (ref 0.0–149.0)
VLDL: 36.6 mg/dL (ref 0.0–40.0)

## 2014-07-04 LAB — BASIC METABOLIC PANEL
BUN: 11 mg/dL (ref 6–23)
CALCIUM: 9.4 mg/dL (ref 8.4–10.5)
CO2: 28 meq/L (ref 19–32)
Chloride: 105 mEq/L (ref 96–112)
Creatinine, Ser: 1 mg/dL (ref 0.4–1.2)
GFR: 57.04 mL/min — ABNORMAL LOW (ref >60.00)
Glucose, Bld: 96 mg/dL (ref 70–99)
POTASSIUM: 4.2 meq/L (ref 3.5–5.1)
SODIUM: 140 meq/L (ref 135–145)

## 2014-07-04 LAB — HEPATIC FUNCTION PANEL
ALT: 14 U/L (ref 0–35)
AST: 25 U/L (ref 0–37)
Albumin: 4.2 g/dL (ref 3.5–5.2)
Alkaline Phosphatase: 70 U/L (ref 39–117)
BILIRUBIN DIRECT: 0.1 mg/dL (ref 0.0–0.3)
Total Bilirubin: 1.1 mg/dL (ref 0.2–1.2)
Total Protein: 7.4 g/dL (ref 6.0–8.3)

## 2014-07-04 NOTE — Progress Notes (Signed)
   Subjective:    Patient ID: Brianna Farrell, female    DOB: [birth year], 63 y.o.   MRN: [id number]  HPI  Pt here with URI symptoms. No fever, sore throat etc. She also needs labs.  No other complaints, She is also due for labs.  Review of Systems As above    Objective:   Physical Exam BP 138/84  Pulse 75  Temp(Src) 98.5 F (36.9 C) (Oral)  Wt 174 lb 9.7 oz (79.2 kg)  SpO2 96% General appearance: alert, cooperative, appears stated age and no distress Ears: normal TM's and external ear canals both ears Nose: clear discharge, mild congestion, no sinus tenderness Throat: lips, mucosa, and tongue normal; teeth and gums normal Neck: no adenopathy, supple, symmetrical, trachea midline and thyroid not enlarged, symmetric, no tenderness/mass/nodules Lungs: clear to auscultation bilaterally Heart: S1, S2 normal Extremities: extremities normal, atraumatic, no cyanosis or edema       Assessment & Plan:  1. Other and unspecified hyperlipidemia Check labs - Basic metabolic panel - Hepatic function panel - Lipid panel  2. Essential hypertension stable - Basic metabolic panel  3. Seasonal allergies  - azelastine (ASTELIN) 0.1 % nasal spray; Place 2 sprays into both nostrils 2 (two) times daily. Use in each nostril as directed  Dispense: 30 mL; Refill: 12 - predniSONE (DELTASONE) 10 MG tablet; 3 po qd for 3 days then 2 po qd for 3 days the 1 po qd for 3 days  Dispense: 18 tablet; Refill: 0  4. Gastroesophageal reflux disease without esophagitis  - pantoprazole (PROTONIX) 40 MG tablet; Take 1 tablet (40 mg total) by mouth daily.  Dispense: 90 tablet; Refill: 3

## 2014-07-08 ENCOUNTER — Telehealth: Payer: Self-pay | Admitting: Family Medicine

## 2014-07-08 DIAGNOSIS — J302 Other seasonal allergic rhinitis: Secondary | ICD-10-CM

## 2014-07-08 MED ORDER — AZELASTINE HCL 0.1 % NA SOLN
2.0000 | Freq: Two times a day (BID) | NASAL | Status: DC
Start: 2014-07-08 — End: 2015-07-15

## 2014-07-08 NOTE — Telephone Encounter (Signed)
Rx re-faxed    KP 

## 2014-07-08 NOTE — Telephone Encounter (Signed)
Caller name: Wanisha  Relation to pt: self  Call back number: [phone] Pharmacy: [facility]   Reason for call:   As per pt pharmacy has not received, please refill azelastine (ASTELIN) 0.1 % nasal spray

## 2014-08-05 DIAGNOSIS — Z23 Encounter for immunization: Secondary | ICD-10-CM | POA: Diagnosis not present

## 2014-08-26 DIAGNOSIS — Z01419 Encounter for gynecological examination (general) (routine) without abnormal findings: Secondary | ICD-10-CM | POA: Diagnosis not present

## 2014-08-26 DIAGNOSIS — M958 Other specified acquired deformities of musculoskeletal system: Secondary | ICD-10-CM | POA: Diagnosis not present

## 2014-08-26 DIAGNOSIS — Z1231 Encounter for screening mammogram for malignant neoplasm of breast: Secondary | ICD-10-CM | POA: Diagnosis not present

## 2014-08-26 LAB — HM MAMMOGRAPHY

## 2014-11-27 DIAGNOSIS — H3531 Nonexudative age-related macular degeneration: Secondary | ICD-10-CM | POA: Diagnosis not present

## 2014-11-28 IMAGING — US US ABDOMEN COMPLETE
1 series · 14 of 25 positions shown · non-contrast
Comparison: None.

CLINICAL DATA: Dyspepsia.

EXAM:
ULTRASOUND ABDOMEN COMPLETE

[Series 1: us abdomen complete · 0.24mm/px · 14 of 104 slices shown]
[im 1/104]
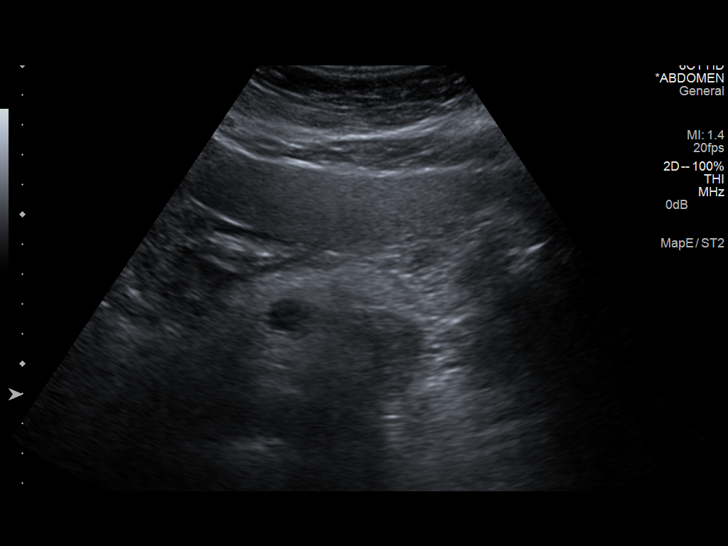
[im 9/104]
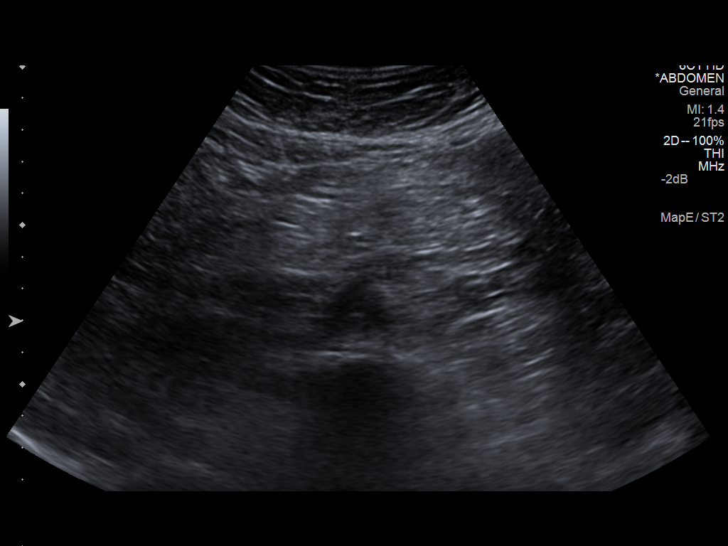
[im 18/104]
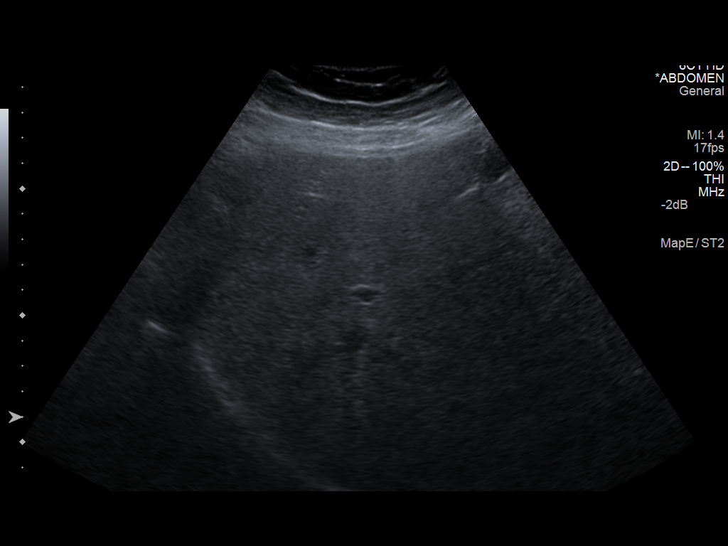
[im 26/104]
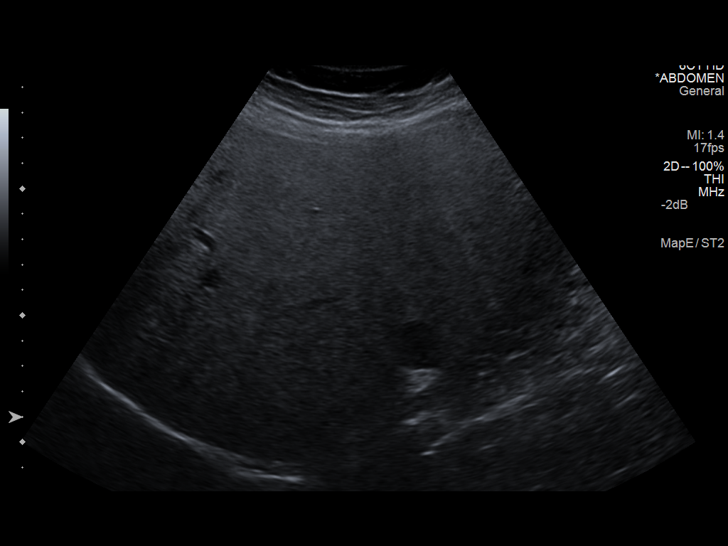
[im 35/104]
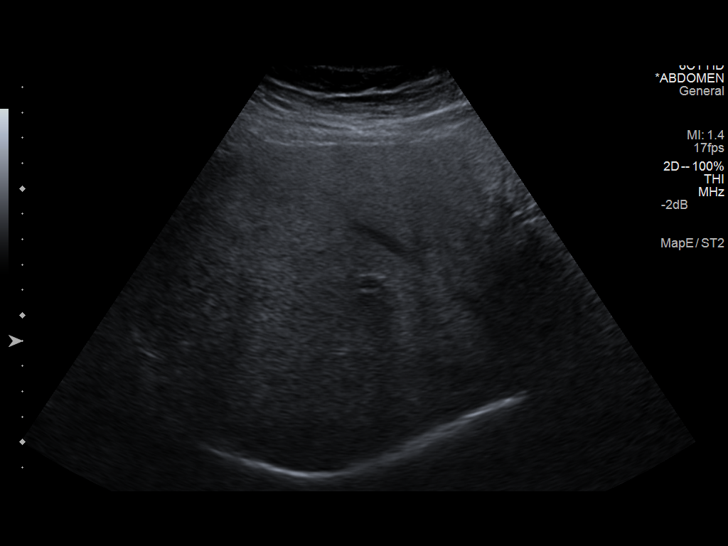
[im 39/104]
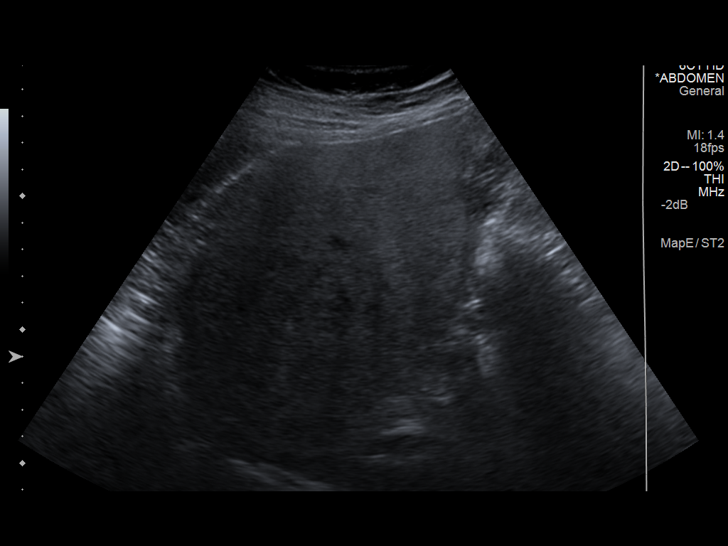
[im 48/104]
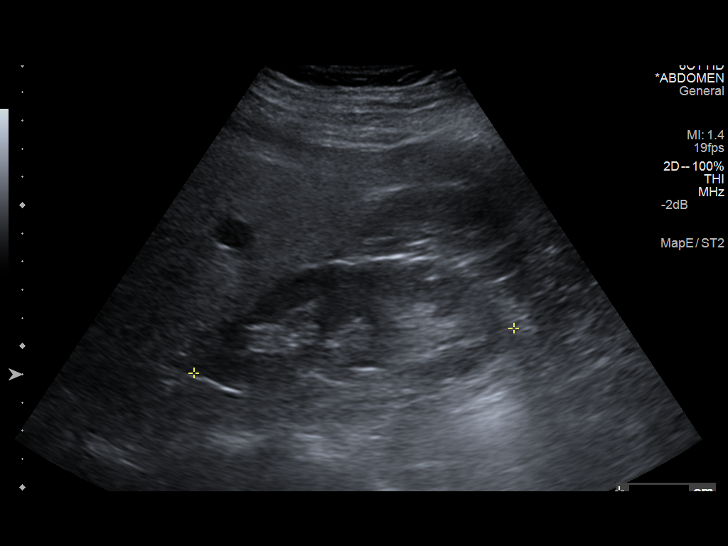
[im 56/104]
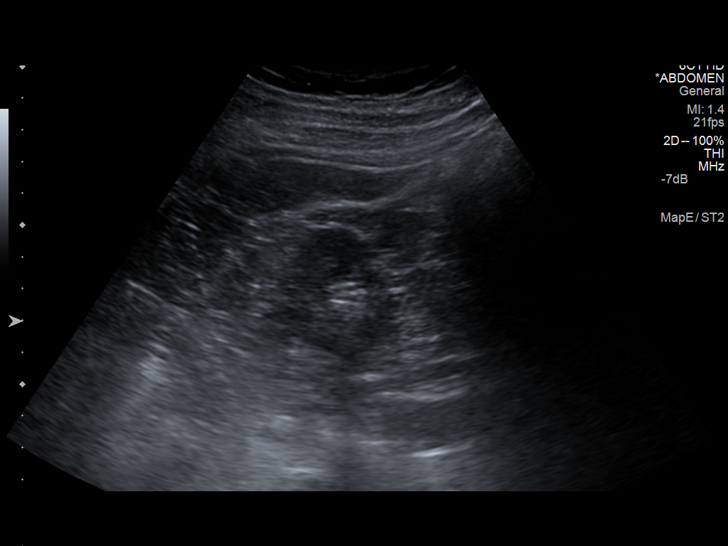
[im 65/104]
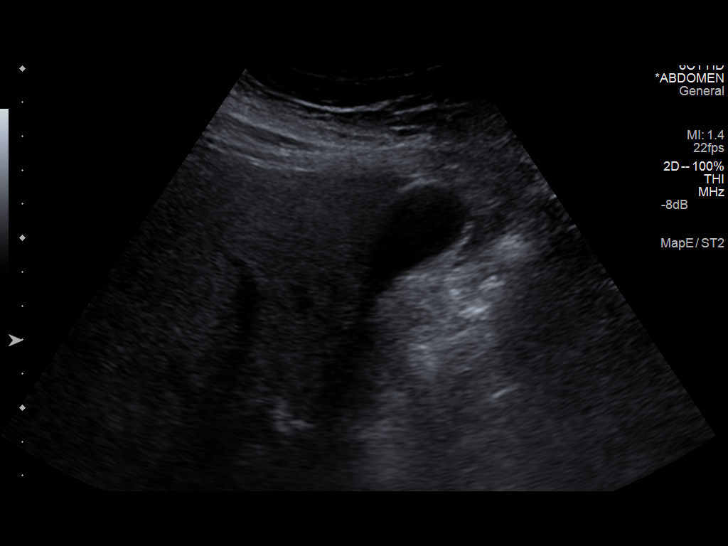
[im 69/104]
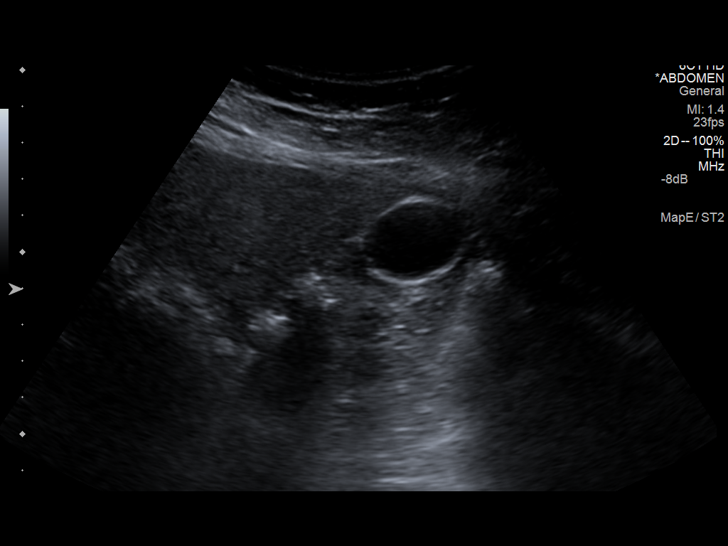
[im 78/104]
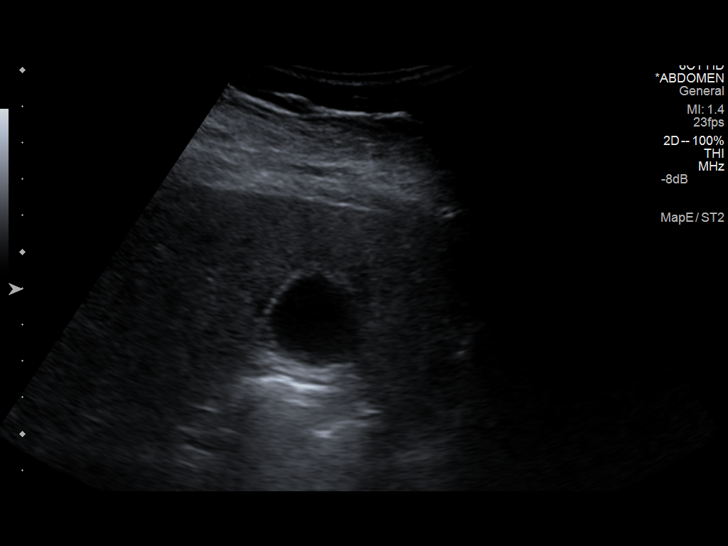
[im 86/104]
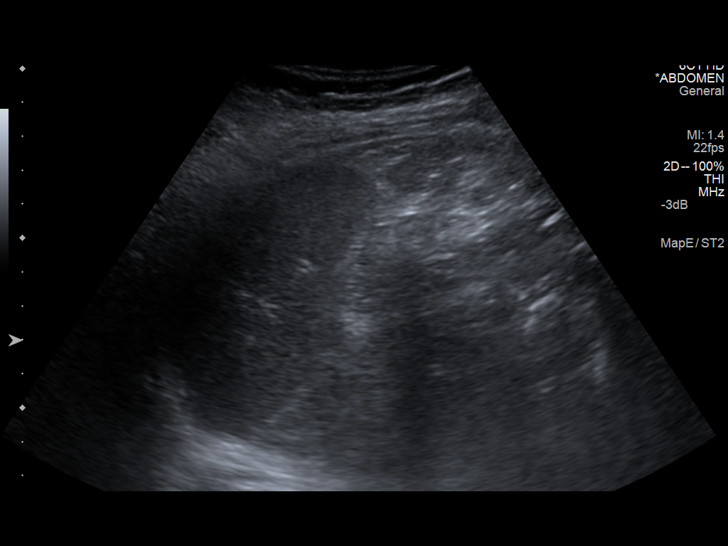
[im 95/104]
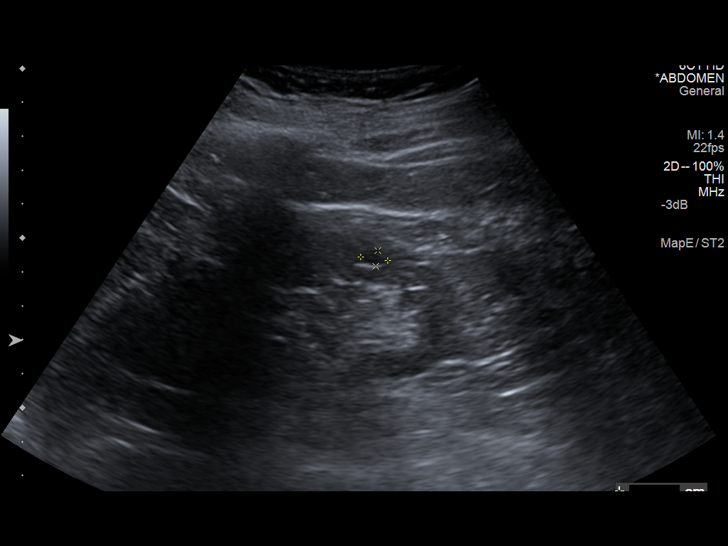
[im 104/104]
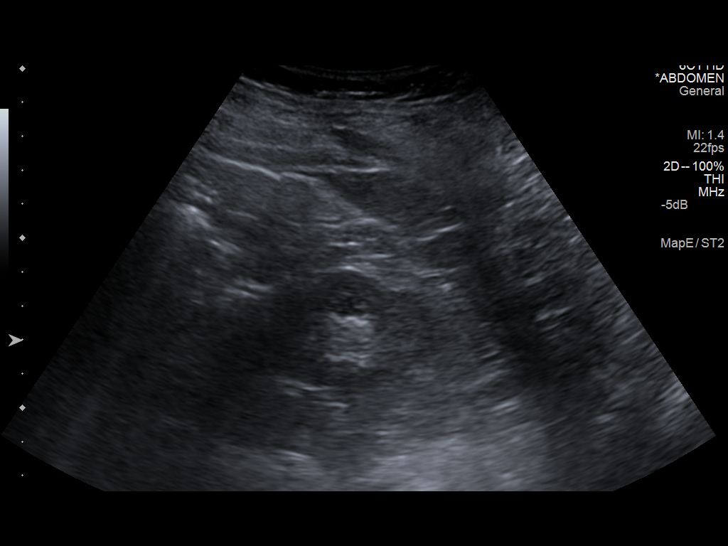

[14 of 25 positions shown; findings below may reference images not displayed]

FINDINGS: Gallbladder:

No gallstones or wall thickening visualized. No sonographic Murphy
sign noted.

Common bile duct:

Diameter: 4.2 mm

Liver:

Liver is echogenic. There are several small cysts, a 12 mm cyst and
a 2 cm cyst, both in the right lobe, and a 9 mm cyst that projects
in the medial segment of the left lobe. No solid liver masses. No
evidence of malignancy. Normal hepatopetal flow in the portal vein.

IVC:

No abnormality visualized.

Pancreas:

Visualized portion unremarkable.

Spleen:

Size and appearance within normal limits.

Right Kidney:

Length: 11.5 cm. Echogenicity within normal limits. No mass or
hydronephrosis visualized.

Left Kidney:

Length: 10.2 cm. Normal parenchymal echogenicity. 8 mm midpole cyst.
No other renal masses. No hydronephrosis.

Abdominal aorta:

No aneurysm visualized.

Other findings:

None.
IMPRESSION: 1. No acute findings.  No evidence of malignancy.
2. Hepatic steatosis.  Three small liver cysts.
3. Sub cm left renal cyst.
4. No other abnormalities.

## 2015-01-02 ENCOUNTER — Ambulatory Visit: Payer: Medicare Other | Admitting: Family Medicine

## 2015-01-05 ENCOUNTER — Encounter: Payer: Self-pay | Admitting: Family Medicine

## 2015-01-05 ENCOUNTER — Ambulatory Visit (INDEPENDENT_AMBULATORY_CARE_PROVIDER_SITE_OTHER): Payer: Medicare Other | Admitting: Family Medicine

## 2015-01-05 VITALS — BP 138/87 | HR 76 | Temp 98.3°F | Wt 170.8 lb

## 2015-01-05 DIAGNOSIS — Z23 Encounter for immunization: Secondary | ICD-10-CM

## 2015-01-05 DIAGNOSIS — E785 Hyperlipidemia, unspecified: Secondary | ICD-10-CM | POA: Diagnosis not present

## 2015-01-05 DIAGNOSIS — I10 Essential (primary) hypertension: Secondary | ICD-10-CM | POA: Diagnosis not present

## 2015-01-05 NOTE — Progress Notes (Signed)
Pre visit review using our clinic review tool, if applicable. No additional management support is needed unless otherwise documented below in the visit note. 

## 2015-01-05 NOTE — Progress Notes (Signed)
Subjective:    Patient ID: Brianna Farrell, female    DOB: [birth year], 64 y.o.   MRN: [id number]  HPI  Patient here for bp and cholesterol.   Pt is exercising 1 mile a day when weather is nice.  Past Medical History  Diagnosis Date  . Arthritis   . Chicken pox   . GERD (gastroesophageal reflux disease)   . History of stomach ulcers   . HTN (hypertension)   . Hyperlipidemia   . Migraines   . Urine incontinence   . Macular degeneration   . IBS (irritable bowel syndrome)   . Anemia   . Pneumonia     Review of Systems  Constitutional: Positive for fatigue. Negative for activity change, appetite change and unexpected weight change.  Respiratory: Negative for cough and shortness of breath.   Cardiovascular: Negative for chest pain and palpitations.  Psychiatric/Behavioral: Negative for behavioral problems and dysphoric mood. The patient is not nervous/anxious.     Current Outpatient Prescriptions on File Prior to Visit  Medication Sig Dispense Refill  . acetaminophen (TYLENOL) 500 MG tablet Take 500 mg by mouth every 6 (six) hours as needed.    Marland Kitchen azelastine (ASTELIN) 0.1 % nasal spray Place 2 sprays into both nostrils 2 (two) times daily. Use in each nostril as directed 30 mL 12  . cetirizine (ZYRTEC) 10 MG tablet Take 5 mg by mouth as needed.     . hyoscyamine (LEVSIN/SL) 0.125 MG SL tablet Place 1 tablet (0.125 mg total) under the tongue every 4 (four) hours as needed. 30 tablet 0  . loperamide (IMODIUM A-D) 2 MG tablet Take 2 mg by mouth 4 (four) times daily as needed for diarrhea or loose stools.    . pantoprazole (PROTONIX) 40 MG tablet Take 1 tablet (40 mg total) by mouth daily. 90 tablet 3  . tetrahydrozoline-zinc (VISINE-AC) 0.05-0.25 % ophthalmic solution 2 drops 3 (three) times daily as needed.     No current facility-administered medications on file prior to visit.       Objective:    Physical Exam  Constitutional: She is oriented to person, place, and time. She  appears well-developed and well-nourished. No distress.  HENT:  Right Ear: External ear normal.  Left Ear: External ear normal.  Nose: Nose normal.  Mouth/Throat: Oropharynx is clear and moist.  Eyes: EOM are normal. Pupils are equal, round, and reactive to light.  Neck: Normal range of motion. Neck supple.  Cardiovascular: Normal rate, regular rhythm and normal heart sounds.   No murmur heard. Pulmonary/Chest: Effort normal and breath sounds normal. No respiratory distress. She has no wheezes. She has no rales. She exhibits no tenderness.  Neurological: She is alert and oriented to person, place, and time.  Psychiatric: She has a normal mood and affect. Her behavior is normal. Judgment and thought content normal.    BP 138/87 mmHg  Pulse 76  Temp(Src) 98.3 F (36.8 C) (Oral)  Wt 170 lb 12.8 oz (77.474 kg)  SpO2 96% Wt Readings from Last 3 Encounters:  01/05/15 170 lb 12.8 oz (77.474 kg)  07/03/14 174 lb 9.7 oz (79.2 kg)  06/11/14 176 lb (79.833 kg)     Lab Results  Component Value Date   WBC 6.7 04/01/2014   HGB 14.1 04/01/2014   HCT 41.7 04/01/2014   PLT 370.0 04/01/2014   GLUCOSE 96 07/03/2014   CHOL 219* 07/03/2014   TRIG 183.0* 07/03/2014   HDL 34.40* 07/03/2014   LDLCALC 148* 07/03/2014  ALT 14 07/03/2014   AST 25 07/03/2014   NA 140 07/03/2014   K 4.2 07/03/2014   CL 105 07/03/2014   CREATININE 1.0 07/03/2014   BUN 11 07/03/2014   CO2 28 07/03/2014   TSH 1.46 04/01/2014       Assessment & Plan:   Problem List Items Addressed This Visit    None    Visit Diagnoses    Need for pneumococcal vaccination    -  Primary    Relevant Orders    Pneumococcal conjugate vaccine 13-valent (Completed)    Essential hypertension        Relevant Medications    aspirin 81 MG tablet    Other Relevant Orders    Basic metabolic panel    Hepatic function panel    Hyperlipidemia        Relevant Medications    aspirin 81 MG tablet    Other Relevant Orders    Lipid  panel       I have discontinued Ms. Conklin predniSONE. I am also having her maintain her cetirizine, loperamide, acetaminophen, tetrahydrozoline-zinc, hyoscyamine, pantoprazole, azelastine, aspirin, Vitamin D, multivitamin, PRESERVISION AREDS 2, and CORAL CALCIUM PO.  Meds ordered this encounter  Medications  . aspirin 81 MG tablet    Sig: Take 81 mg by mouth daily.  . Cholecalciferol (VITAMIN D) 2000 UNITS CAPS    Sig: Take by mouth.  . Multiple Vitamin (MULTIVITAMIN) tablet    Sig: Take 1 tablet by mouth daily.  . Multiple Vitamins-Minerals (PRESERVISION AREDS 2) CAPS    Sig: Take by mouth.  . CORAL CALCIUM PO    Sig: Take by mouth.     Garnet Koyanagi, DO

## 2015-01-05 NOTE — Patient Instructions (Signed)

## 2015-01-06 LAB — BASIC METABOLIC PANEL
BUN: 12 mg/dL (ref 6–23)
CO2: 29 meq/L (ref 19–32)
CREATININE: 0.99 mg/dL (ref 0.40–1.20)
Calcium: 9.7 mg/dL (ref 8.4–10.5)
Chloride: 107 mEq/L (ref 96–112)
GFR: 59.62 mL/min — ABNORMAL LOW (ref >60.00)
Glucose, Bld: 88 mg/dL (ref 70–99)
Potassium: 3.8 mEq/L (ref 3.5–5.1)
Sodium: 141 mEq/L (ref 135–145)

## 2015-01-06 LAB — HEPATIC FUNCTION PANEL
ALK PHOS: 80 U/L (ref 39–117)
ALT: 38 U/L — ABNORMAL HIGH (ref 0–35)
AST: 40 U/L — AB (ref 0–37)
Albumin: 4.4 g/dL (ref 3.5–5.2)
BILIRUBIN DIRECT: 0.1 mg/dL (ref 0.0–0.3)
BILIRUBIN TOTAL: 1.1 mg/dL (ref 0.2–1.2)
Total Protein: 7 g/dL (ref 6.0–8.3)

## 2015-01-06 LAB — LIPID PANEL
CHOL/HDL RATIO: 5
Cholesterol: 209 mg/dL — ABNORMAL HIGH (ref 0–200)
HDL: 44.5 mg/dL (ref >39.00)
LDL CALC: 138 mg/dL — AB (ref 0–99)
NONHDL: 164.5
Triglycerides: 134 mg/dL (ref 0.0–149.0)
VLDL: 26.8 mg/dL (ref 0.0–40.0)

## 2015-01-28 DIAGNOSIS — H02834 Dermatochalasis of left upper eyelid: Secondary | ICD-10-CM | POA: Diagnosis not present

## 2015-01-28 DIAGNOSIS — H02831 Dermatochalasis of right upper eyelid: Secondary | ICD-10-CM | POA: Diagnosis not present

## 2015-01-28 DIAGNOSIS — G7000 Myasthenia gravis without (acute) exacerbation: Secondary | ICD-10-CM | POA: Diagnosis not present

## 2015-01-30 DIAGNOSIS — G7000 Myasthenia gravis without (acute) exacerbation: Secondary | ICD-10-CM | POA: Diagnosis not present

## 2015-03-25 ENCOUNTER — Encounter: Payer: Self-pay | Admitting: Behavioral Health

## 2015-04-20 ENCOUNTER — Encounter: Payer: Self-pay | Admitting: *Deleted

## 2015-07-15 ENCOUNTER — Encounter: Payer: Self-pay | Admitting: Behavioral Health

## 2015-07-15 ENCOUNTER — Telehealth: Payer: Self-pay | Admitting: Behavioral Health

## 2015-07-15 NOTE — Addendum Note (Signed)
Addended by: Eduard Roux E on: 07/15/2015 11:08 AM   Modules accepted: Orders, Medications

## 2015-07-15 NOTE — Telephone Encounter (Signed)
Unable to reach patient at time of Pre-Visit Call.  Left message for patient to return call when available.    

## 2015-07-15 NOTE — Telephone Encounter (Signed)
Pre-Visit Call completed with patient and chart updated.   Pre-Visit Info documented in Specialty Comments under [employer].    

## 2015-07-16 ENCOUNTER — Ambulatory Visit (INDEPENDENT_AMBULATORY_CARE_PROVIDER_SITE_OTHER): Payer: Medicare Other | Admitting: Family Medicine

## 2015-07-16 ENCOUNTER — Encounter: Payer: Self-pay | Admitting: Family Medicine

## 2015-07-16 VITALS — BP 123/82 | HR 68 | Temp 97.9°F | Ht 64.0 in | Wt 167.8 lb

## 2015-07-16 DIAGNOSIS — Z136 Encounter for screening for cardiovascular disorders: Secondary | ICD-10-CM | POA: Diagnosis not present

## 2015-07-16 DIAGNOSIS — Z0000 Encounter for general adult medical examination without abnormal findings: Secondary | ICD-10-CM

## 2015-07-16 DIAGNOSIS — Z1159 Encounter for screening for other viral diseases: Secondary | ICD-10-CM | POA: Diagnosis not present

## 2015-07-16 DIAGNOSIS — M2010 Hallux valgus (acquired), unspecified foot: Secondary | ICD-10-CM | POA: Diagnosis not present

## 2015-07-16 LAB — POCT URINALYSIS DIPSTICK
Bilirubin, UA: NEGATIVE
Blood, UA: NEGATIVE
Glucose, UA: NEGATIVE
KETONES UA: NEGATIVE
LEUKOCYTES UA: NEGATIVE
Nitrite, UA: NEGATIVE
PH UA: 6
PROTEIN UA: NEGATIVE
SPEC GRAV UA: 1.025
Urobilinogen, UA: 0.2

## 2015-07-16 LAB — COMPREHENSIVE METABOLIC PANEL
ALBUMIN: 4.4 g/dL (ref 3.5–5.2)
ALT: 33 U/L (ref 0–35)
AST: 33 U/L (ref 0–37)
Alkaline Phosphatase: 78 U/L (ref 39–117)
BUN: 13 mg/dL (ref 6–23)
CO2: 30 mEq/L (ref 19–32)
CREATININE: 1.02 mg/dL (ref 0.40–1.20)
Calcium: 9.7 mg/dL (ref 8.4–10.5)
Chloride: 106 mEq/L (ref 96–112)
GFR: 57.5 mL/min — ABNORMAL LOW (ref >60.00)
GLUCOSE: 105 mg/dL — AB (ref 70–99)
Potassium: 4.2 mEq/L (ref 3.5–5.1)
SODIUM: 144 meq/L (ref 135–145)
TOTAL PROTEIN: 7.5 g/dL (ref 6.0–8.3)
Total Bilirubin: 1.2 mg/dL (ref 0.2–1.2)

## 2015-07-16 LAB — LIPID PANEL
Cholesterol: 200 mg/dL (ref 0–200)
HDL: 44.5 mg/dL (ref >39.00)
LDL Cholesterol: 130 mg/dL — ABNORMAL HIGH (ref 0–99)
NonHDL: 155.02
Total CHOL/HDL Ratio: 4
Triglycerides: 123 mg/dL (ref 0.0–149.0)
VLDL: 24.6 mg/dL (ref 0.0–40.0)

## 2015-07-16 NOTE — Progress Notes (Signed)
Subjective:   Brianna Farrell is a 64 y.o. female who presents for an Initial Medicare Annual Wellness Visit.  Review of Systems     Review of Systems  Constitutional: Negative for activity change, appetite change and fatigue.  HENT: Negative for hearing loss, congestion, tinnitus and ear discharge.   Eyes: Negative for visual disturbance (see optho q1y -- vision corrected to 20/20 with glasses).  Respiratory: Negative for cough, chest tightness and shortness of breath.   Cardiovascular: Negative for chest pain, palpitations and leg swelling.  Gastrointestinal: Negative for abdominal pain, diarrhea, constipation and abdominal distention.  Genitourinary: Negative for urgency, frequency, decreased urine volume and difficulty urinating.  Musculoskeletal: Negative for back pain, and gait problem. + arthralgias in feet Skin: Negative for color change, pallor and rash.  Neurological: Negative for dizziness, light-headedness, numbness and headaches.  Hematological: Negative for adenopathy. Does not bruise/bleed easily.  Psychiatric/Behavioral: Negative for suicidal ideas, confusion, sleep disturbance, self-injury, dysphoric mood, decreased concentration and agitation.  Pt is able to read and write and can Brianna Farrell all ADLs No risk for falling No abuse/ violence in home           Objective:    Today's Vitals   07/16/15 1039  BP: 123/82  Pulse: 68  Temp: 97.9 F (36.6 C)  TempSrc: Oral  Height: 5' 4"  (1.626 m)  Weight: 167 lb 12.8 oz (76.114 kg)  SpO2: 99%  BP 123/82 mmHg  Pulse 68  Temp(Src) 97.9 F (36.6 C) (Oral)  Ht 5' 4"  (1.626 m)  Wt 167 lb 12.8 oz (76.114 kg)  BMI 28.79 kg/m2  SpO2 99% General appearance: alert, cooperative, appears stated age and no distress Head: Normocephalic, without obvious abnormality, atraumatic Eyes: conjunctivae/corneas clear. PERRL, EOM's intact. Fundi benign. Ears: normal TM's and external ear canals both ears Nose: Nares normal. Septum  midline. Mucosa normal. No drainage or sinus tenderness. Throat: lips, mucosa, and tongue normal; teeth and gums normal Neck: no adenopathy, no carotid bruit, no JVD, supple, symmetrical, trachea midline and thyroid not enlarged, symmetric, no tenderness/mass/nodules Back: symmetric, no curvature. ROM normal. No CVA tenderness. Lungs: clear to auscultation bilaterally Breasts: normal appearance, no masses or tenderness Heart: regular rate and rhythm, S1, S2 normal, no murmur, click, rub or gallop Abdomen: soft, non-tender; bowel sounds normal; no masses,  no organomegaly Pelvic: deferred Extremities: extremities normal, atraumatic, no cyanosis or edema Pulses: 2+ and symmetric Skin: Skin color, texture, turgor normal. No rashes or lesions Lymph nodes: Cervical, supraclavicular, and axillary nodes normal. Neurologic: Alert and oriented X 3, normal strength and tone. Normal symmetric reflexes. Normal coordination and gait Psych= no depression, no anxiety Current Medications (verified) Outpatient Encounter Prescriptions as of 07/16/2015  Medication Sig  . cetirizine (ZYRTEC) 10 MG tablet Take 5 mg by mouth as needed.   . hyoscyamine (LEVSIN/SL) 0.125 MG SL tablet Place 1 tablet (0.125 mg total) under the tongue every 4 (four) hours as needed.  . loperamide (IMODIUM A-D) 2 MG tablet Take 2 mg by mouth 4 (four) times daily as needed for diarrhea or loose stools.  . pantoprazole (PROTONIX) 40 MG tablet Take 1 tablet (40 mg total) by mouth daily.  Marland Kitchen tetrahydrozoline-zinc (VISINE-AC) 0.05-0.25 % ophthalmic solution 2 drops 3 (three) times daily as needed.  . [DISCONTINUED] acetaminophen (TYLENOL) 500 MG tablet Take 500 mg by mouth every 6 (six) hours as needed.   No facility-administered encounter medications on file as of 07/16/2015.    Allergies (verified) Ibuprofen and Nsaids  History: Past Medical History  Diagnosis Date  . Arthritis   . Chicken pox   . GERD (gastroesophageal reflux  disease)   . History of stomach ulcers   . HTN (hypertension)   . Hyperlipidemia   . Migraines   . Urine incontinence   . Macular degeneration   . IBS (irritable bowel syndrome)   . Anemia   . Pneumonia    Past Surgical History  Procedure Laterality Date  . Appendectomy  1985  . Tonsillectomy and adenoidectomy  1953  . Abdominal hysterectomy      Still has Ovaries  . Dilation and curettage of uterus  1984  . Shoulder surgery Right     Shoulder Manipulation   Family History  Problem Relation Age of Onset  . Arthritis Mother   . Osteoarthritis Mother   . Rheum arthritis Mother   . Ovarian cancer Cousin     x 2  . Breast cancer Maternal Aunt   . Hyperlipidemia Father   . Hyperlipidemia Brother   . Heart disease Father   . Heart disease Brother   . Stroke Paternal Aunt   . Kidney disease Mother   . Breast cancer Paternal Aunt    Social History   Occupational History  . retired    Social History Main Topics  . Smoking status: Never Smoker   . Smokeless tobacco: Never Used  . Alcohol Use: No  . Drug Use: No  . Sexual Activity: Not on file    Tobacco Counseling Counseling given: Not Answered   Activities of Daily Living In your present state of health, Brianna Farrell you have any difficulty performing the following activities: 07/16/2015  Hearing? Y  Vision? N  Difficulty concentrating or making decisions? Y  Walking or climbing stairs? N  Dressing or bathing? N  Doing errands, shopping? N    Immunizations and Health Maintenance Immunization History  Administered Date(s) Administered  . Influenza-Unspecified 08/24/2014  . Pneumococcal Conjugate-13 01/05/2015  . Tdap 02/06/2015  . Zoster 09/30/2014   Health Maintenance Due  Topic Date Due  . Hepatitis C Screening  04-May-1951    Patient Care Team: Rosalita Chessman, Brianna Farrell as PCP - General (Family Medicine) Marylynn Pearson, MD as Consulting Physician (Obstetrics and Gynecology) Lafayette Dragon, MD as Consulting  Physician (Gastroenterology)  Indicate any recent Medical Services you may have received from other than [facility] providers in the past year (date may be approximate).     Assessment:   This is a routine wellness examination for Brianna Farrell.   Hearing/Vision screen Per opth--- hearing neg whisper test  Dietary issues and exercise activities discussed: no exercise secondary to feet    Goals    None     Depression Screen PHQ 2/9 Scores 07/16/2015 04/01/2014  PHQ - 2 Score 0 0    Fall Risk Fall Risk  07/16/2015 04/01/2014  Falls in the past year? Yes No  Number falls in past yr: 1 -  Injury with Fall? No -  Risk for fall due to : Other (Comment) -  Risk for fall due to (comments): The puppy knocked the patient down -    Cognitive Function: Normal ---- mmse 30/30  Screening Tests Health Maintenance  Topic Date Due  . Hepatitis C Screening  August 22, 1951  . INFLUENZA VACCINE  08/15/2015 (Originally 05/25/2015)  . PNA vac Low Risk Adult (2 of 2 - PPSV23) 01/05/2016  . MAMMOGRAM  08/26/2016  . COLONOSCOPY  06/11/2024  . TETANUS/TDAP  02/05/2025  .  DEXA SCAN  Completed  . ZOSTAVAX  Completed      Plan:    [facility] See avs During the course of the visit, Brianna Farrell was educated and counseled about the following appropriate screening and preventive services:   Vaccines to include Pneumoccal, Influenza, Hepatitis B, Td, Zostavax, HCV  Electrocardiogram  Cardiovascular disease screening  Colorectal cancer screening  Bone density screening  Diabetes screening  Glaucoma screening  Mammography/PAP  Nutrition counseling  Smoking cessation counseling  Patient Instructions (the written plan) were given to the patient.  1. Bunion, unspecified laterality   - Ambulatory referral to Podiatry  2. Ischemic heart disease screen   - Comp Met (CMET) - Lipid panel - POCT urinalysis dipstick  3. Need for hepatitis C screening test   - Hepatitis C antibody  4. Routine history and  physical examination of adult     Brianna Koyanagi, Brianna Farrell   07/16/2015

## 2015-07-16 NOTE — Patient Instructions (Addendum)
Preventive Care for Adults A healthy lifestyle and preventive care can promote health and wellness. Preventive health guidelines for women include the following key practices.  A routine yearly physical is a good way to check with your health care provider about your health and preventive screening. It is a chance to share any concerns and updates on your health and to receive a thorough exam.  Visit your dentist for a routine exam and preventive care every 6 months. Brush your teeth twice a day and floss once a day. Good oral hygiene prevents tooth decay and gum disease.  The frequency of eye exams is based on your age, health, family medical history, use of contact lenses, and other factors. Follow your health care provider's recommendations for frequency of eye exams.  Eat a healthy diet. Foods like vegetables, fruits, whole grains, low-fat dairy products, and lean protein foods contain the nutrients you need without too many calories. Decrease your intake of foods high in solid fats, added sugars, and salt. Eat the right amount of calories for you.Get information about a proper diet from your health care provider, if necessary.  Regular physical exercise is one of the most important things you can do for your health. Most adults should get at least 150 minutes of moderate-intensity exercise (any activity that increases your heart rate and causes you to sweat) each week. In addition, most adults need muscle-strengthening exercises on 2 or more days a week.  Maintain a healthy weight. The body mass index (BMI) is a screening tool to identify possible weight problems. It provides an estimate of body fat based on height and weight. Your health care provider can find your BMI and can help you achieve or maintain a healthy weight.For adults 20 years and older:  A BMI below 18.5 is considered underweight.  A BMI of 18.5 to 24.9 is normal.  A BMI of 25 to 29.9 is considered overweight.  A BMI of  30 and above is considered obese.  Maintain normal blood lipids and cholesterol levels by exercising and minimizing your intake of saturated fat. Eat a balanced diet with plenty of fruit and vegetables. Blood tests for lipids and cholesterol should begin at age 76 and be repeated every 5 years. If your lipid or cholesterol levels are high, you are over 50, or you are at high risk for heart disease, you may need your cholesterol levels checked more frequently.Ongoing high lipid and cholesterol levels should be treated with medicines if diet and exercise are not working.  If you smoke, find out from your health care provider how to quit. If you do not use tobacco, do not start.  Lung cancer screening is recommended for adults aged 22-80 years who are at high risk for developing lung cancer because of a history of smoking. A yearly low-dose CT scan of the lungs is recommended for people who have at least a 30-pack-year history of smoking and are a current smoker or have quit within the past 15 years. A pack year of smoking is smoking an average of 1 pack of cigarettes a day for 1 year (for example: 1 pack a day for 30 years or 2 packs a day for 15 years). Yearly screening should continue until the smoker has stopped smoking for at least 15 years. Yearly screening should be stopped for people who develop a health problem that would prevent them from having lung cancer treatment.  If you are pregnant, do not drink alcohol. If you are breastfeeding,  be very cautious about drinking alcohol. If you are not pregnant and choose to drink alcohol, do not have more than 1 drink per day. One drink is considered to be 12 ounces (355 mL) of beer, 5 ounces (148 mL) of wine, or 1.5 ounces (44 mL) of liquor.  Avoid use of street drugs. Do not share needles with anyone. Ask for help if you need support or instructions about stopping the use of drugs.  High blood pressure causes heart disease and increases the risk of  stroke. Your blood pressure should be checked at least every 1 to 2 years. Ongoing high blood pressure should be treated with medicines if weight loss and exercise do not work.  If you are 3-86 years old, ask your health care provider if you should take aspirin to prevent strokes.  Diabetes screening involves taking a blood sample to check your fasting blood sugar level. This should be done once every 3 years, after age 67, if you are within normal weight and without risk factors for diabetes. Testing should be considered at a younger age or be carried out more frequently if you are overweight and have at least 1 risk factor for diabetes.  Breast cancer screening is essential preventive care for women. You should practice "breast self-awareness." This means understanding the normal appearance and feel of your breasts and may include breast self-examination. Any changes detected, no matter how small, should be reported to a health care provider. Women in their 8s and 30s should have a clinical breast exam (CBE) by a health care provider as part of a regular health exam every 1 to 3 years. After age 70, women should have a CBE every year. Starting at age 25, women should consider having a mammogram (breast X-ray test) every year. Women who have a family history of breast cancer should talk to their health care provider about genetic screening. Women at a high risk of breast cancer should talk to their health care providers about having an MRI and a mammogram every year.  Breast cancer gene (BRCA)-related cancer risk assessment is recommended for women who have family members with BRCA-related cancers. BRCA-related cancers include breast, ovarian, tubal, and peritoneal cancers. Having family members with these cancers may be associated with an increased risk for harmful changes (mutations) in the breast cancer genes BRCA1 and BRCA2. Results of the assessment will determine the need for genetic counseling and  BRCA1 and BRCA2 testing.  Routine pelvic exams to screen for cancer are no longer recommended for nonpregnant women who are considered low risk for cancer of the pelvic organs (ovaries, uterus, and vagina) and who do not have symptoms. Ask your health care provider if a screening pelvic exam is right for you.  If you have had past treatment for cervical cancer or a condition that could lead to cancer, you need Pap tests and screening for cancer for at least 20 years after your treatment. If Pap tests have been discontinued, your risk factors (such as having a new sexual partner) need to be reassessed to determine if screening should be resumed. Some women have medical problems that increase the chance of getting cervical cancer. In these cases, your health care provider may recommend more frequent screening and Pap tests.  The HPV test is an additional test that may be used for cervical cancer screening. The HPV test looks for the virus that can cause the cell changes on the cervix. The cells collected during the Pap test can be  tested for HPV. The HPV test could be used to screen women aged 30 years and older, and should be used in women of any age who have unclear Pap test results. After the age of 30, women should have HPV testing at the same frequency as a Pap test.  Colorectal cancer can be detected and often prevented. Most routine colorectal cancer screening begins at the age of 50 years and continues through age 75 years. However, your health care provider may recommend screening at an earlier age if you have risk factors for colon cancer. On a yearly basis, your health care provider may provide home test kits to check for hidden blood in the stool. Use of a small camera at the end of a tube, to directly examine the colon (sigmoidoscopy or colonoscopy), can detect the earliest forms of colorectal cancer. Talk to your health care provider about this at age 50, when routine screening begins. Direct  exam of the colon should be repeated every 5-10 years through age 75 years, unless early forms of pre-cancerous polyps or small growths are found.  People who are at an increased risk for hepatitis B should be screened for this virus. You are considered at high risk for hepatitis B if:  You were born in a country where hepatitis B occurs often. Talk with your health care provider about which countries are considered high risk.  Your parents were born in a high-risk country and you have not received a shot to protect against hepatitis B (hepatitis B vaccine).  You have HIV or AIDS.  You use needles to inject street drugs.  You live with, or have sex with, someone who has hepatitis B.  You get hemodialysis treatment.  You take certain medicines for conditions like cancer, organ transplantation, and autoimmune conditions.  Hepatitis C blood testing is recommended for all people born from 1945 through 1965 and any individual with known risks for hepatitis C.  Practice safe sex. Use condoms and avoid high-risk sexual practices to reduce the spread of sexually transmitted infections (STIs). STIs include gonorrhea, chlamydia, syphilis, trichomonas, herpes, HPV, and human immunodeficiency virus (HIV). Herpes, HIV, and HPV are viral illnesses that have no cure. They can result in disability, cancer, and death.  You should be screened for sexually transmitted illnesses (STIs) including gonorrhea and chlamydia if:  You are sexually active and are younger than 24 years.  You are older than 24 years and your health care provider tells you that you are at risk for this type of infection.  Your sexual activity has changed since you were last screened and you are at an increased risk for chlamydia or gonorrhea. Ask your health care provider if you are at risk.  If you are at risk of being infected with HIV, it is recommended that you take a prescription medicine daily to prevent HIV infection. This is  called preexposure prophylaxis (PrEP). You are considered at risk if:  You are a heterosexual woman, are sexually active, and are at increased risk for HIV infection.  You take drugs by injection.  You are sexually active with a partner who has HIV.  Talk with your health care provider about whether you are at high risk of being infected with HIV. If you choose to begin PrEP, you should first be tested for HIV. You should then be tested every 3 months for as long as you are taking PrEP.  Osteoporosis is a disease in which the bones lose minerals and strength   with aging. This can result in serious bone fractures or breaks. The risk of osteoporosis can be identified using a bone density scan. Women ages 65 years and over and women at risk for fractures or osteoporosis should discuss screening with their health care providers. Ask your health care provider whether you should take a calcium supplement or vitamin D to reduce the rate of osteoporosis.  Menopause can be associated with physical symptoms and risks. Hormone replacement therapy is available to decrease symptoms and risks. You should talk to your health care provider about whether hormone replacement therapy is right for you.  Use sunscreen. Apply sunscreen liberally and repeatedly throughout the day. You should seek shade when your shadow is shorter than you. Protect yourself by wearing long sleeves, pants, a wide-brimmed hat, and sunglasses year round, whenever you are outdoors.  Once a month, do a whole body skin exam, using a mirror to look at the skin on your back. Tell your health care provider of new moles, moles that have irregular borders, moles that are larger than a pencil eraser, or moles that have changed in shape or color.  Stay current with required vaccines (immunizations).  Influenza vaccine. All adults should be immunized every year.  Tetanus, diphtheria, and acellular pertussis (Td, Tdap) vaccine. Pregnant women should  receive 1 dose of Tdap vaccine during each pregnancy. The dose should be obtained regardless of the length of time since the last dose. Immunization is preferred during the 27th-36th week of gestation. An adult who has not previously received Tdap or who does not know her vaccine status should receive 1 dose of Tdap. This initial dose should be followed by tetanus and diphtheria toxoids (Td) booster doses every 10 years. Adults with an unknown or incomplete history of completing a 3-dose immunization series with Td-containing vaccines should begin or complete a primary immunization series including a Tdap dose. Adults should receive a Td booster every 10 years.  Varicella vaccine. An adult without evidence of immunity to varicella should receive 2 doses or a second dose if she has previously received 1 dose. Pregnant females who do not have evidence of immunity should receive the first dose after pregnancy. This first dose should be obtained before leaving the health care facility. The second dose should be obtained 4-8 weeks after the first dose.  Human papillomavirus (HPV) vaccine. Females aged 13-26 years who have not received the vaccine previously should obtain the 3-dose series. The vaccine is not recommended for use in pregnant females. However, pregnancy testing is not needed before receiving a dose. If a female is found to be pregnant after receiving a dose, no treatment is needed. In that case, the remaining doses should be delayed until after the pregnancy. Immunization is recommended for any person with an immunocompromised condition through the age of 26 years if she did not get any or all doses earlier. During the 3-dose series, the second dose should be obtained 4-8 weeks after the first dose. The third dose should be obtained 24 weeks after the first dose and 16 weeks after the second dose.  Zoster vaccine. One dose is recommended for adults aged 60 years or older unless certain conditions are  present.  Measles, mumps, and rubella (MMR) vaccine. Adults born before 1957 generally are considered immune to measles and mumps. Adults born in 1957 or later should have 1 or more doses of MMR vaccine unless there is a contraindication to the vaccine or there is laboratory evidence of immunity to   each of the three diseases. A routine second dose of MMR vaccine should be obtained at least 28 days after the first dose for students attending postsecondary schools, health care workers, or international travelers. People who received inactivated measles vaccine or an unknown type of measles vaccine during 1963-1967 should receive 2 doses of MMR vaccine. People who received inactivated mumps vaccine or an unknown type of mumps vaccine before 1979 and are at high risk for mumps infection should consider immunization with 2 doses of MMR vaccine. For females of childbearing age, rubella immunity should be determined. If there is no evidence of immunity, females who are not pregnant should be vaccinated. If there is no evidence of immunity, females who are pregnant should delay immunization until after pregnancy. Unvaccinated health care workers born before 1957 who lack laboratory evidence of measles, mumps, or rubella immunity or laboratory confirmation of disease should consider measles and mumps immunization with 2 doses of MMR vaccine or rubella immunization with 1 dose of MMR vaccine.  Pneumococcal 13-valent conjugate (PCV13) vaccine. When indicated, a person who is uncertain of her immunization history and has no record of immunization should receive the PCV13 vaccine. An adult aged 19 years or older who has certain medical conditions and has not been previously immunized should receive 1 dose of PCV13 vaccine. This PCV13 should be followed with a dose of pneumococcal polysaccharide (PPSV23) vaccine. The PPSV23 vaccine dose should be obtained at least 8 weeks after the dose of PCV13 vaccine. An adult aged 19  years or older who has certain medical conditions and previously received 1 or more doses of PPSV23 vaccine should receive 1 dose of PCV13. The PCV13 vaccine dose should be obtained 1 or more years after the last PPSV23 vaccine dose.  Pneumococcal polysaccharide (PPSV23) vaccine. When PCV13 is also indicated, PCV13 should be obtained first. All adults aged 65 years and older should be immunized. An adult younger than age 65 years who has certain medical conditions should be immunized. Any person who resides in a nursing home or long-term care facility should be immunized. An adult smoker should be immunized. People with an immunocompromised condition and certain other conditions should receive both PCV13 and PPSV23 vaccines. People with human immunodeficiency virus (HIV) infection should be immunized as soon as possible after diagnosis. Immunization during chemotherapy or radiation therapy should be avoided. Routine use of PPSV23 vaccine is not recommended for American Indians, [place] Natives, or people younger than 65 years unless there are medical conditions that require PPSV23 vaccine. When indicated, people who have unknown immunization and have no record of immunization should receive PPSV23 vaccine. One-time revaccination 5 years after the first dose of PPSV23 is recommended for people aged 19-64 years who have chronic kidney failure, nephrotic syndrome, asplenia, or immunocompromised conditions. People who received 1-2 doses of PPSV23 before age 65 years should receive another dose of PPSV23 vaccine at age 65 years or later if at least 5 years have passed since the previous dose. Doses of PPSV23 are not needed for people immunized with PPSV23 at or after age 65 years.  Meningococcal vaccine. Adults with asplenia or persistent complement component deficiencies should receive 2 doses of quadrivalent meningococcal conjugate (MenACWY-D) vaccine. The doses should be obtained at least 2 months apart.  Microbiologists working with certain meningococcal bacteria, military recruits, people at risk during an outbreak, and people who travel to or live in countries with a high rate of meningitis should be immunized. A first-year college student up through age   21 years who is living in a residence hall should receive a dose if she did not receive a dose on or after her 16th birthday. Adults who have certain high-risk conditions should receive one or more doses of vaccine.  Hepatitis A vaccine. Adults who wish to be protected from this disease, have certain high-risk conditions, work with hepatitis A-infected animals, work in hepatitis A research labs, or travel to or work in countries with a high rate of hepatitis A should be immunized. Adults who were previously unvaccinated and who anticipate close contact with an international adoptee during the first 60 days after arrival in the [place] from a country with a high rate of hepatitis A should be immunized.  Hepatitis B vaccine. Adults who wish to be protected from this disease, have certain high-risk conditions, may be exposed to blood or other infectious body fluids, are household contacts or sex partners of hepatitis B positive people, are clients or workers in certain care facilities, or travel to or work in countries with a high rate of hepatitis B should be immunized.  Haemophilus influenzae type b (Hib) vaccine. A previously unvaccinated person with asplenia or sickle cell disease or having a scheduled splenectomy should receive 1 dose of Hib vaccine. Regardless of previous immunization, a recipient of a hematopoietic stem cell transplant should receive a 3-dose series 6-12 months after her successful transplant. Hib vaccine is not recommended for adults with HIV infection. Preventive Services / Frequency Ages 64 to 68 years  Blood pressure check.** / Every 1 to 2 years.  Lipid and cholesterol check.** / Every 5 years beginning at age  22.  Clinical breast exam.** / Every 3 years for women in their 88s and 53s.  BRCA-related cancer risk assessment.** / For women who have family members with a BRCA-related cancer (breast, ovarian, tubal, or peritoneal cancers).  Pap test.** / Every 2 years from ages 90 through 51. Every 3 years starting at age 21 through age 56 or 3 with a history of 3 consecutive normal Pap tests.  HPV screening.** / Every 3 years from ages 24 through ages 1 to 46 with a history of 3 consecutive normal Pap tests.  Hepatitis C blood test.** / For any individual with known risks for hepatitis C.  Skin self-exam. / Monthly.  Influenza vaccine. / Every year.  Tetanus, diphtheria, and acellular pertussis (Tdap, Td) vaccine.** / Consult your health care provider. Pregnant women should receive 1 dose of Tdap vaccine during each pregnancy. 1 dose of Td every 10 years.  Varicella vaccine.** / Consult your health care provider. Pregnant females who do not have evidence of immunity should receive the first dose after pregnancy.  HPV vaccine. / 3 doses over 6 months, if 72 and younger. The vaccine is not recommended for use in pregnant females. However, pregnancy testing is not needed before receiving a dose.  Measles, mumps, rubella (MMR) vaccine.** / You need at least 1 dose of MMR if you were born in 1957 or later. You may also need a 2nd dose. For females of childbearing age, rubella immunity should be determined. If there is no evidence of immunity, females who are not pregnant should be vaccinated. If there is no evidence of immunity, females who are pregnant should delay immunization until after pregnancy.  Pneumococcal 13-valent conjugate (PCV13) vaccine.** / Consult your health care provider.  Pneumococcal polysaccharide (PPSV23) vaccine.** / 1 to 2 doses if you smoke cigarettes or if you have certain conditions.  Meningococcal vaccine.** /  1 dose if you are age 19 to 21 years and a first-year college  student living in a residence hall, or have one of several medical conditions, you need to get vaccinated against meningococcal disease. You may also need additional booster doses.  Hepatitis A vaccine.** / Consult your health care provider.  Hepatitis B vaccine.** / Consult your health care provider.  Haemophilus influenzae type b (Hib) vaccine.** / Consult your health care provider. Ages 40 to 64 years  Blood pressure check.** / Every 1 to 2 years.  Lipid and cholesterol check.** / Every 5 years beginning at age 20 years.  Lung cancer screening. / Every year if you are aged 55-80 years and have a 30-pack-year history of smoking and currently smoke or have quit within the past 15 years. Yearly screening is stopped once you have quit smoking for at least 15 years or develop a health problem that would prevent you from having lung cancer treatment.  Clinical breast exam.** / Every year after age 40 years.  BRCA-related cancer risk assessment.** / For women who have family members with a BRCA-related cancer (breast, ovarian, tubal, or peritoneal cancers).  Mammogram.** / Every year beginning at age 40 years and continuing for as long as you are in good health. Consult with your health care provider.  Pap test.** / Every 3 years starting at age 30 years through age 65 or 70 years with a history of 3 consecutive normal Pap tests.  HPV screening.** / Every 3 years from ages 30 years through ages 65 to 70 years with a history of 3 consecutive normal Pap tests.  Fecal occult blood test (FOBT) of stool. / Every year beginning at age 50 years and continuing until age 75 years. You may not need to do this test if you get a colonoscopy every 10 years.  Flexible sigmoidoscopy or colonoscopy.** / Every 5 years for a flexible sigmoidoscopy or every 10 years for a colonoscopy beginning at age 50 years and continuing until age 75 years.  Hepatitis C blood test.** / For all people born from 1945 through  1965 and any individual with known risks for hepatitis C.  Skin self-exam. / Monthly.  Influenza vaccine. / Every year.  Tetanus, diphtheria, and acellular pertussis (Tdap/Td) vaccine.** / Consult your health care provider. Pregnant women should receive 1 dose of Tdap vaccine during each pregnancy. 1 dose of Td every 10 years.  Varicella vaccine.** / Consult your health care provider. Pregnant females who do not have evidence of immunity should receive the first dose after pregnancy.  Zoster vaccine.** / 1 dose for adults aged 60 years or older.  Measles, mumps, rubella (MMR) vaccine.** / You need at least 1 dose of MMR if you were born in 1957 or later. You may also need a 2nd dose. For females of childbearing age, rubella immunity should be determined. If there is no evidence of immunity, females who are not pregnant should be vaccinated. If there is no evidence of immunity, females who are pregnant should delay immunization until after pregnancy.  Pneumococcal 13-valent conjugate (PCV13) vaccine.** / Consult your health care provider.  Pneumococcal polysaccharide (PPSV23) vaccine.** / 1 to 2 doses if you smoke cigarettes or if you have certain conditions.  Meningococcal vaccine.** / Consult your health care provider.  Hepatitis A vaccine.** / Consult your health care provider.  Hepatitis B vaccine.** / Consult your health care provider.  Haemophilus influenzae type b (Hib) vaccine.** / Consult your health care provider. Ages 65   years and over  Blood pressure check.** / Every 1 to 2 years.  Lipid and cholesterol check.** / Every 5 years beginning at age 72 years.  Lung cancer screening. / Every year if you are aged 52-80 years and have a 30-pack-year history of smoking and currently smoke or have quit within the past 15 years. Yearly screening is stopped once you have quit smoking for at least 15 years or develop a health problem that would prevent you from having lung cancer  treatment.  Clinical breast exam.** / Every year after age 70 years.  BRCA-related cancer risk assessment.** / For women who have family members with a BRCA-related cancer (breast, ovarian, tubal, or peritoneal cancers).  Mammogram.** / Every year beginning at age 51 years and continuing for as long as you are in good health. Consult with your health care provider.  Pap test.** / Every 3 years starting at age 5 years through age 92 or 21 years with 3 consecutive normal Pap tests. Testing can be stopped between 65 and 70 years with 3 consecutive normal Pap tests and no abnormal Pap or HPV tests in the past 10 years.  HPV screening.** / Every 3 years from ages 70 years through ages 71 or 59 years with a history of 3 consecutive normal Pap tests. Testing can be stopped between 65 and 70 years with 3 consecutive normal Pap tests and no abnormal Pap or HPV tests in the past 10 years.  Fecal occult blood test (FOBT) of stool. / Every year beginning at age 93 years and continuing until age 25 years. You may not need to do this test if you get a colonoscopy every 10 years.  Flexible sigmoidoscopy or colonoscopy.** / Every 5 years for a flexible sigmoidoscopy or every 10 years for a colonoscopy beginning at age 76 years and continuing until age 110 years.  Hepatitis C blood test.** / For all people born from [birth year] through 1967 and any individual with known risks for hepatitis C.  Osteoporosis screening.** / A one-time screening for women ages 48 years and over and women at risk for fractures or osteoporosis.  Skin self-exam. / Monthly.  Influenza vaccine. / Every year.  Tetanus, diphtheria, and acellular pertussis (Tdap/Td) vaccine.** / 1 dose of Td every 10 years.  Varicella vaccine.** / Consult your health care provider.  Zoster vaccine.** / 1 dose for adults aged 69 years or older.  Pneumococcal 13-valent conjugate (PCV13) vaccine.** / Consult your health care provider.  Pneumococcal  polysaccharide (PPSV23) vaccine.** / 1 dose for all adults aged 59 years and older.  Meningococcal vaccine.** / Consult your health care provider.  Hepatitis A vaccine.** / Consult your health care provider.  Hepatitis B vaccine.** / Consult your health care provider.  Haemophilus influenzae type b (Hib) vaccine.** / Consult your health care provider. ** Family history and personal history of risk and conditions may change your health care provider's recommendations. Document Released: 12/06/2001 Document Revised: 02/24/2014 Document Reviewed: 03/07/2011 [facility] Patient Information 2015 [place], [place]. This information is not intended to replace advice given to you by your health care provider. Make sure you discuss any questions you have with your health care provider.

## 2015-07-16 NOTE — Progress Notes (Signed)
Pre visit review using our clinic review tool, if applicable. No additional management support is needed unless otherwise documented below in the visit note. 

## 2015-07-17 ENCOUNTER — Telehealth: Payer: Self-pay | Admitting: Family Medicine

## 2015-07-17 DIAGNOSIS — E785 Hyperlipidemia, unspecified: Secondary | ICD-10-CM

## 2015-07-17 LAB — HEPATITIS C ANTIBODY: HCV AB: NEGATIVE

## 2015-07-17 NOTE — Telephone Encounter (Signed)
Caller name:Martasia Dicesare Relationship to patient:self Can be [phone] Pharmacy:  Reason for call:Requesting lab results

## 2015-07-17 NOTE — Telephone Encounter (Signed)
Please advise      KP 

## 2015-07-20 NOTE — Telephone Encounter (Signed)
Spoke with patient and reviewed labs per PCP recommendations. Patient verbalizes agreement with plan.  Enacted new lab orders. Instructed patient to schedule lab appt in 3-6 months.

## 2015-07-20 NOTE — Telephone Encounter (Signed)
See labs 

## 2015-07-29 DIAGNOSIS — M2012 Hallux valgus (acquired), left foot: Secondary | ICD-10-CM | POA: Diagnosis not present

## 2015-07-29 DIAGNOSIS — M2011 Hallux valgus (acquired), right foot: Secondary | ICD-10-CM | POA: Diagnosis not present

## 2015-07-29 DIAGNOSIS — M7751 Other enthesopathy of right foot: Secondary | ICD-10-CM | POA: Diagnosis not present

## 2015-07-29 DIAGNOSIS — M7752 Other enthesopathy of left foot: Secondary | ICD-10-CM | POA: Diagnosis not present

## 2015-08-02 DIAGNOSIS — Z23 Encounter for immunization: Secondary | ICD-10-CM | POA: Diagnosis not present

## 2015-08-19 DIAGNOSIS — M216X1 Other acquired deformities of right foot: Secondary | ICD-10-CM | POA: Diagnosis not present

## 2015-08-19 DIAGNOSIS — M2011 Hallux valgus (acquired), right foot: Secondary | ICD-10-CM | POA: Diagnosis not present

## 2015-08-19 DIAGNOSIS — M2012 Hallux valgus (acquired), left foot: Secondary | ICD-10-CM | POA: Diagnosis not present

## 2015-08-19 DIAGNOSIS — M216X2 Other acquired deformities of left foot: Secondary | ICD-10-CM | POA: Diagnosis not present

## 2015-09-08 DIAGNOSIS — H02421 Myogenic ptosis of right eyelid: Secondary | ICD-10-CM | POA: Diagnosis not present

## 2015-09-15 DIAGNOSIS — Z124 Encounter for screening for malignant neoplasm of cervix: Secondary | ICD-10-CM | POA: Diagnosis not present

## 2015-09-15 DIAGNOSIS — Z6829 Body mass index (BMI) 29.0-29.9, adult: Secondary | ICD-10-CM | POA: Diagnosis not present

## 2015-09-15 DIAGNOSIS — Z1231 Encounter for screening mammogram for malignant neoplasm of breast: Secondary | ICD-10-CM | POA: Diagnosis not present

## 2015-09-22 DIAGNOSIS — M216X1 Other acquired deformities of right foot: Secondary | ICD-10-CM | POA: Diagnosis not present

## 2015-09-22 DIAGNOSIS — M2011 Hallux valgus (acquired), right foot: Secondary | ICD-10-CM | POA: Diagnosis not present

## 2015-09-22 DIAGNOSIS — M216X2 Other acquired deformities of left foot: Secondary | ICD-10-CM | POA: Diagnosis not present

## 2015-09-22 DIAGNOSIS — M2012 Hallux valgus (acquired), left foot: Secondary | ICD-10-CM | POA: Diagnosis not present

## 2015-10-08 ENCOUNTER — Other Ambulatory Visit (INDEPENDENT_AMBULATORY_CARE_PROVIDER_SITE_OTHER): Payer: Medicare Other

## 2015-10-08 DIAGNOSIS — E785 Hyperlipidemia, unspecified: Secondary | ICD-10-CM

## 2015-10-08 DIAGNOSIS — Z01818 Encounter for other preprocedural examination: Secondary | ICD-10-CM | POA: Diagnosis not present

## 2015-10-08 LAB — HEPATIC FUNCTION PANEL
ALT: 32 U/L (ref 0–35)
AST: 30 U/L (ref 0–37)
Albumin: 4.3 g/dL (ref 3.5–5.2)
Alkaline Phosphatase: 83 U/L (ref 39–117)
BILIRUBIN DIRECT: 0.2 mg/dL (ref 0.0–0.3)
Total Bilirubin: 1.1 mg/dL (ref 0.2–1.2)
Total Protein: 7.1 g/dL (ref 6.0–8.3)

## 2015-10-08 LAB — LIPID PANEL
Cholesterol: 218 mg/dL — ABNORMAL HIGH (ref 0–200)
HDL: 44.8 mg/dL
LDL Cholesterol: 149 mg/dL — ABNORMAL HIGH (ref 0–99)
NonHDL: 173.2
Total CHOL/HDL Ratio: 5
Triglycerides: 120 mg/dL (ref 0.0–149.0)
VLDL: 24 mg/dL (ref 0.0–40.0)

## 2015-10-13 ENCOUNTER — Other Ambulatory Visit: Payer: Self-pay | Admitting: Family Medicine

## 2015-10-13 DIAGNOSIS — M2011 Hallux valgus (acquired), right foot: Secondary | ICD-10-CM | POA: Diagnosis not present

## 2015-10-19 DIAGNOSIS — M2041 Other hammer toe(s) (acquired), right foot: Secondary | ICD-10-CM | POA: Diagnosis not present

## 2015-10-19 DIAGNOSIS — M2010 Hallux valgus (acquired), unspecified foot: Secondary | ICD-10-CM | POA: Diagnosis not present

## 2015-10-19 DIAGNOSIS — M205X1 Other deformities of toe(s) (acquired), right foot: Secondary | ICD-10-CM | POA: Diagnosis not present

## 2015-10-19 DIAGNOSIS — M205X9 Other deformities of toe(s) (acquired), unspecified foot: Secondary | ICD-10-CM | POA: Diagnosis not present

## 2015-10-19 DIAGNOSIS — M216X1 Other acquired deformities of right foot: Secondary | ICD-10-CM | POA: Diagnosis not present

## 2015-10-19 DIAGNOSIS — M2040 Other hammer toe(s) (acquired), unspecified foot: Secondary | ICD-10-CM | POA: Diagnosis not present

## 2015-10-19 DIAGNOSIS — M779 Enthesopathy, unspecified: Secondary | ICD-10-CM | POA: Diagnosis not present

## 2015-10-19 DIAGNOSIS — M2011 Hallux valgus (acquired), right foot: Secondary | ICD-10-CM | POA: Diagnosis not present

## 2015-10-21 ENCOUNTER — Encounter: Payer: Self-pay | Admitting: *Deleted

## 2015-10-23 DIAGNOSIS — M2011 Hallux valgus (acquired), right foot: Secondary | ICD-10-CM | POA: Diagnosis not present

## 2015-11-09 DIAGNOSIS — M2011 Hallux valgus (acquired), right foot: Secondary | ICD-10-CM | POA: Diagnosis not present

## 2015-11-11 ENCOUNTER — Telehealth: Payer: Self-pay | Admitting: Family Medicine

## 2015-11-11 NOTE — Telephone Encounter (Signed)
Pt had foot surgery 12/26, hospital prescribed medication that caused her to have a yeast infection. She says that the provider that seen her only gave her a few pills. She says that usually Diflucan will help with her yeast infections. She would like to know if PCP could sent a Rx to pharmacy for her.    Pharmacy: [id number] - [place], [place] AT [address]: [phone]

## 2015-11-12 ENCOUNTER — Other Ambulatory Visit: Payer: Self-pay | Admitting: Family Medicine

## 2015-11-12 DIAGNOSIS — N760 Acute vaginitis: Secondary | ICD-10-CM

## 2015-11-12 MED ORDER — FLUCONAZOLE 150 MG PO TABS: 150.0000 mg | ORAL_TABLET | Freq: Once | ORAL | Status: DC

## 2015-11-12 NOTE — Telephone Encounter (Signed)
Patient was last seen 07/16/15. Please advise     KP

## 2015-11-12 NOTE — Telephone Encounter (Signed)
i sent it in

## 2015-11-24 DIAGNOSIS — M2011 Hallux valgus (acquired), right foot: Secondary | ICD-10-CM | POA: Diagnosis not present

## 2015-12-08 DIAGNOSIS — M2011 Hallux valgus (acquired), right foot: Secondary | ICD-10-CM | POA: Diagnosis not present

## 2015-12-17 DIAGNOSIS — H353131 Nonexudative age-related macular degeneration, bilateral, early dry stage: Secondary | ICD-10-CM | POA: Diagnosis not present

## 2015-12-25 ENCOUNTER — Telehealth: Payer: Self-pay | Admitting: Family Medicine

## 2015-12-25 NOTE — Telephone Encounter (Signed)
Updated.      KP 

## 2015-12-25 NOTE — Telephone Encounter (Signed)
Pt had flu shot 07/2015 at [facility]

## 2016-03-11 DIAGNOSIS — H02421 Myogenic ptosis of right eyelid: Secondary | ICD-10-CM | POA: Diagnosis not present

## 2016-04-21 DIAGNOSIS — J019 Acute sinusitis, unspecified: Secondary | ICD-10-CM | POA: Diagnosis not present

## 2016-04-21 DIAGNOSIS — H60339 Swimmer's ear, unspecified ear: Secondary | ICD-10-CM | POA: Diagnosis not present

## 2016-06-14 DIAGNOSIS — L821 Other seborrheic keratosis: Secondary | ICD-10-CM | POA: Diagnosis not present

## 2016-06-14 DIAGNOSIS — D239 Other benign neoplasm of skin, unspecified: Secondary | ICD-10-CM | POA: Diagnosis not present

## 2016-06-14 DIAGNOSIS — L570 Actinic keratosis: Secondary | ICD-10-CM | POA: Diagnosis not present

## 2016-07-18 ENCOUNTER — Ambulatory Visit: Payer: PRIVATE HEALTH INSURANCE | Admitting: Family Medicine

## 2016-07-21 DIAGNOSIS — J069 Acute upper respiratory infection, unspecified: Secondary | ICD-10-CM | POA: Diagnosis not present

## 2016-07-21 DIAGNOSIS — R05 Cough: Secondary | ICD-10-CM | POA: Diagnosis not present

## 2016-08-23 DIAGNOSIS — Z23 Encounter for immunization: Secondary | ICD-10-CM | POA: Diagnosis not present

## 2016-09-27 ENCOUNTER — Ambulatory Visit (INDEPENDENT_AMBULATORY_CARE_PROVIDER_SITE_OTHER): Payer: Medicare Other | Admitting: Family Medicine

## 2016-09-27 ENCOUNTER — Encounter: Payer: Self-pay | Admitting: Family Medicine

## 2016-09-27 VITALS — BP 152/78 | HR 73 | Temp 98.1°F | Resp 16 | Ht 64.0 in | Wt 175.0 lb

## 2016-09-27 DIAGNOSIS — H9190 Unspecified hearing loss, unspecified ear: Secondary | ICD-10-CM | POA: Diagnosis not present

## 2016-09-27 DIAGNOSIS — Z0000 Encounter for general adult medical examination without abnormal findings: Secondary | ICD-10-CM

## 2016-09-27 DIAGNOSIS — Z23 Encounter for immunization: Secondary | ICD-10-CM

## 2016-09-27 DIAGNOSIS — Z136 Encounter for screening for cardiovascular disorders: Secondary | ICD-10-CM | POA: Diagnosis not present

## 2016-09-27 DIAGNOSIS — K219 Gastro-esophageal reflux disease without esophagitis: Secondary | ICD-10-CM | POA: Diagnosis not present

## 2016-09-27 LAB — POCT URINALYSIS DIPSTICK
Bilirubin, UA: NEGATIVE
Blood, UA: NEGATIVE
GLUCOSE UA: NEGATIVE
Ketones, UA: NEGATIVE
Leukocytes, UA: NEGATIVE
NITRITE UA: NEGATIVE
Protein, UA: NEGATIVE
Spec Grav, UA: 1.02
UROBILINOGEN UA: 0.2
pH, UA: 6

## 2016-09-27 MED ORDER — PANTOPRAZOLE SODIUM 40 MG PO TBEC: 40.0000 mg | DELAYED_RELEASE_TABLET | Freq: Every day | ORAL | 3 refills | Status: DC

## 2016-09-27 NOTE — Progress Notes (Signed)
Pre visit review using our clinic review tool, if applicable. No additional management support is needed unless otherwise documented below in the visit note. 

## 2016-09-27 NOTE — Patient Instructions (Signed)
Preventive Care 65 Years and Older, Female Preventive care refers to lifestyle choices and visits with your health care provider that can promote health and wellness. What does preventive care include?  A yearly physical exam. This is also called an annual well check.  Dental exams once or twice a year.  Routine eye exams. Ask your health care provider how often you should have your eyes checked.  Personal lifestyle choices, including:  Daily care of your teeth and gums.  Regular physical activity.  Eating a healthy diet.  Avoiding tobacco and drug use.  Limiting alcohol use.  Practicing safe sex.  Taking low-dose aspirin every day.  Taking vitamin and mineral supplements as recommended by your health care provider. What happens during an annual well check? The services and screenings done by your health care provider during your annual well check will depend on your age, overall health, lifestyle risk factors, and family history of disease. Counseling  Your health care provider may ask you questions about your:  Alcohol use.  Tobacco use.  Drug use.  Emotional well-being.  Home and relationship well-being.  Sexual activity.  Eating habits.  History of falls.  Memory and ability to understand (cognition).  Work and work environment.  Reproductive health. Screening  You may have the following tests or measurements:  Height, weight, and BMI.  Blood pressure.  Lipid and cholesterol levels. These may be checked every 5 years, or more frequently if you are over 50 years old.  Skin check.  Lung cancer screening. You may have this screening every year starting at age 55 if you have a 30-pack-year history of smoking and currently smoke or have quit within the past 15 years.  Fecal occult blood test (FOBT) of the stool. You may have this test every year starting at age 50.  Flexible sigmoidoscopy or colonoscopy. You may have a sigmoidoscopy every 5 years or  a colonoscopy every 10 years starting at age 50.  Hepatitis C blood test.  Hepatitis B blood test.  Sexually transmitted disease (STD) testing.  Diabetes screening. This is done by checking your blood sugar (glucose) after you have not eaten for a while (fasting). You may have this done every 1-3 years.  Bone density scan. This is done to screen for osteoporosis. You may have this done starting at age 65.  Mammogram. This may be done every 1-2 years. Talk to your health care provider about how often you should have regular mammograms. Talk with your health care provider about your test results, treatment options, and if necessary, the need for more tests. Vaccines  Your health care provider may recommend certain vaccines, such as:  Influenza vaccine. This is recommended every year.  Tetanus, diphtheria, and acellular pertussis (Tdap, Td) vaccine. You may need a Td booster every 10 years.  Varicella vaccine. You may need this if you have not been vaccinated.  Zoster vaccine. You may need this after age 60.  Measles, mumps, and rubella (MMR) vaccine. You may need at least one dose of MMR if you were born in 1957 or later. You may also need a second dose.  Pneumococcal 13-valent conjugate (PCV13) vaccine. One dose is recommended after age 65.  Pneumococcal polysaccharide (PPSV23) vaccine. One dose is recommended after age 65.  Meningococcal vaccine. You may need this if you have certain conditions.  Hepatitis A vaccine. You may need this if you have certain conditions or if you travel or work in places where you may be exposed to   hepatitis A.  Hepatitis B vaccine. You may need this if you have certain conditions or if you travel or work in places where you may be exposed to hepatitis B.  Haemophilus influenzae type b (Hib) vaccine. You may need this if you have certain conditions. Talk to your health care provider about which screenings and vaccines you need and how often you need  them. This information is not intended to replace advice given to you by your health care provider. Make sure you discuss any questions you have with your health care provider. Document Released: 11/06/2015 Document Revised: 06/29/2016 Document Reviewed: 08/11/2015 Elsevier Interactive Patient Education  2017 [employer]vier Inc.  

## 2016-09-27 NOTE — Progress Notes (Signed)
Subjective:   Brianna Farrell is a 65 y.o. female who presents for Medicare Annual (Subsequent) preventive examination.  Review of Systems:  . Review of Systems  Constitutional: Negative for activity change, appetite change and fatigue.  HENT: Negative for hearing loss, congestion, tinnitus and ear discharge.   Eyes: Negative for visual disturbance (see optho q1y -- vision corrected to 20/20 with glasses).  Respiratory: Negative for cough, chest tightness and shortness of breath.   Cardiovascular: Negative for chest pain, palpitations and leg swelling.  Gastrointestinal: Negative for abdominal pain, diarrhea, constipation and abdominal distention.  Genitourinary: Negative for urgency, frequency, decreased urine volume and difficulty urinating.  Musculoskeletal: Negative for back pain, arthralgias and gait problem.  Skin: Negative for color change, pallor and rash.  Neurological: Negative for dizziness, light-headedness, numbness and headaches.  Hematological: Negative for adenopathy. Does not bruise/bleed easily.  Psychiatric/Behavioral: Negative for suicidal ideas, confusion, sleep disturbance, self-injury, dysphoric mood, decreased concentration and agitation.  Pt is able to read and write and can do all ADLs No risk for falling No abuse/ violence in home         Objective:     Vitals: BP (!) 152/78 (BP Location: Right Arm, Patient Position: Sitting, Cuff Size: Normal)   Pulse 73   Temp 98.1 F (36.7 C) (Oral)   Resp 16   Ht 5\' 4"  (1.626 m)   Wt 175 lb (79.4 kg)   SpO2 98%   BMI 30.04 kg/m   Body mass index is 30.04 kg/m. BP (!) 152/78 (BP Location: Right Arm, Patient Position: Sitting, Cuff Size: Normal)   Pulse 73   Temp 98.1 F (36.7 C) (Oral)   Resp 16   Ht 5\' 4"  (1.626 m)   Wt 175 lb (79.4 kg)   SpO2 98%   BMI 30.04 kg/m  General appearance: alert, cooperative, appears stated age and no distress Head: Normocephalic, without obvious abnormality,  atraumatic Eyes: conjunctivae/corneas clear. PERRL, EOM's intact. Fundi benign. Ears: normal TM's and external ear canals both ears Nose: Nares normal. Septum midline. Mucosa normal. No drainage or sinus tenderness. Throat: lips, mucosa, and tongue normal; teeth and gums normal Neck: no adenopathy, no carotid bruit, no JVD, supple, symmetrical, trachea midline and thyroid not enlarged, symmetric, no tenderness/mass/nodules Back: symmetric, no curvature. ROM normal. No CVA tenderness. Lungs: clear to auscultation bilaterally Breasts: normal appearance, no masses or tenderness Heart: regular rate and rhythm, S1, S2 normal, no murmur, click, rub or gallop Abdomen: soft, non-tender; bowel sounds normal; no masses,  no organomegaly Pelvic: deferred and not indicated; status post hysterectomy, negative ROS Extremities: extremities normal, atraumatic, no cyanosis or edema Pulses: 2+ and symmetric Skin: Skin color, texture, turgor normal. No rashes or lesions Lymph nodes: Cervical, supraclavicular, and axillary nodes normal. Neurologic: Alert and oriented X 3, normal strength and tone. Normal symmetric reflexes. Normal coordination and gait Tobacco History  Smoking Status  . Never Smoker  Smokeless Tobacco  . Never Used     Counseling given: Not Answered   Past Medical History:  Diagnosis Date  . Anemia   . Arthritis   . Chicken pox   . GERD (gastroesophageal reflux disease)   . History of stomach ulcers   . HTN (hypertension)   . Hyperlipidemia   . IBS (irritable bowel syndrome)   . Macular degeneration   . Migraines   . Pneumonia   . Urine incontinence    Past Surgical History:  Procedure Laterality Date  . ABDOMINAL HYSTERECTOMY  Still has Ovaries  . APPENDECTOMY  1985  . DILATION AND CURETTAGE OF UTERUS  1984  . SHOULDER SURGERY Right    Shoulder Manipulation  . TONSILLECTOMY AND ADENOIDECTOMY  1953   Family History  Problem Relation Age of Onset  . Arthritis  Mother   . Osteoarthritis Mother   . Rheum arthritis Mother   . Ovarian cancer Cousin     x 2  . Breast cancer Maternal Aunt   . Hyperlipidemia Father   . Hyperlipidemia Brother   . Heart disease Father   . Heart disease Brother   . Stroke Paternal Aunt   . Kidney disease Mother   . Breast cancer Paternal Aunt    History  Sexual Activity  . Sexual activity: Not on file    Outpatient Encounter Prescriptions as of 09/27/2016  Medication Sig  . cetirizine (ZYRTEC) 10 MG tablet Take 5 mg by mouth as needed.   Marland Kitchen co-enzyme Q-10 30 MG capsule Take 30 mg by mouth 3 (three) times daily.  Marland Kitchen loperamide (IMODIUM A-D) 2 MG tablet Take 2 mg by mouth 4 (four) times daily as needed for diarrhea or loose stools.  Marland Kitchen MEGARED OMEGA-3 KRILL OIL PO Take 350 mg by mouth.  . Multiple Vitamins-Minerals (PRESERVISION AREDS 2) CAPS Take by mouth.  . pantoprazole (PROTONIX) 40 MG tablet Take 1 tablet (40 mg total) by mouth daily.  Marland Kitchen tetrahydrozoline-zinc (VISINE-AC) 0.05-0.25 % ophthalmic solution 2 drops 3 (three) times daily as needed.  . [DISCONTINUED] fluconazole (DIFLUCAN) 150 MG tablet Take 1 tablet (150 mg total) by mouth once. 1 po qd x1, may repeat in 3 days prn  . [DISCONTINUED] pantoprazole (PROTONIX) 40 MG tablet TAKE 1 TABLET BY MOUTH DAILY  . hyoscyamine (LEVSIN/SL) 0.125 MG SL tablet Place 1 tablet (0.125 mg total) under the tongue every 4 (four) hours as needed. (Patient not taking: Reported on 09/27/2016)   No facility-administered encounter medications on file as of 09/27/2016.     Activities of Daily Living In your present state of health, do you have any difficulty performing the following activities: 09/27/2016  Hearing? Y  Vision? N  Difficulty concentrating or making decisions? N  Walking or climbing stairs? N  Dressing or bathing? N  Doing errands, shopping? N  Some recent data might be hidden    Patient Care Team: Ann Held, DO as PCP - General (Family  Medicine) Marylynn Pearson, MD as Consulting Physician (Obstetrics and Gynecology) Marylynn Pearson, MD as Consulting Physician (Obstetrics and Gynecology) Lavonna Monarch, MD as Consulting Physician (Dermatology)    Assessment:    cpe Exercise Activities and Dietary recommendations Current Exercise Habits: The patient does not participate in regular exercise at present, Exercise limited by: orthopedic condition(s)  Goals    None     Fall Risk Fall Risk  09/27/2016 07/16/2015 04/01/2014  Falls in the past year? Yes Yes No  Number falls in past yr: 1 1 -  Injury with Fall? Yes No -  Risk for fall due to : - Other (Comment) -  Risk for fall due to (comments): - The puppy knocked the patient down -   Depression Screen PHQ 2/9 Scores 09/27/2016 07/16/2015 04/01/2014  PHQ - 2 Score 0 0 0     Cognitive Function MMSE - Mini Mental State Exam 09/27/2016  Orientation to time 5  Orientation to Place 5  Registration 3  Attention/ Calculation 5  Recall 3  Language- name 2 objects 2  Language- repeat 1  Language- follow 3 step command 3  Language- read & follow direction 1  Write a sentence 1  Copy design 1  Total score 30        Immunization History  Administered Date(s) Administered  . Influenza, High Dose Seasonal PF 08/23/2016  . Influenza-Unspecified 08/24/2014, 07/25/2015  . Pneumococcal Conjugate-13 01/05/2015  . Tdap 02/06/2015  . Zoster 09/30/2014   Screening Tests Health Maintenance  Topic Date Due  . PNA vac Low Risk Adult (2 of 2 - PPSV23) 01/05/2016  . MAMMOGRAM  11/10/2017  . COLONOSCOPY  06/11/2024  . TETANUS/TDAP  02/05/2025  . INFLUENZA VACCINE  Completed  . DEXA SCAN  Completed  . ZOSTAVAX  Completed  . Hepatitis C Screening  Completed      Plan:    see AVS During the course of the visit the patient was educated and counseled about the following appropriate screening and preventive services:   Vaccines to include Pneumoccal, Influenza, Hepatitis B, Td,  Zostavax, HCV  Electrocardiogram  Cardiovascular Disease  Colorectal cancer screening  Bone density screening  Diabetes screening  Glaucoma screening  Mammography/PAP  Nutrition counseling   Patient Instructions (the written plan) was given to the patient.  1. Hearing loss, unspecified hearing loss type, unspecified laterality  - Ambulatory referral to Audiology - Lipid panel - CBC with Differential/Platelet - POCT urinalysis dipstick - Comprehensive metabolic panel  2. Gastroesophageal reflux disease, esophagitis presence not specified  - Lipid panel - CBC with Differential/Platelet - POCT urinalysis dipstick - Comprehensive metabolic panel - pantoprazole (PROTONIX) 40 MG tablet; Take 1 tablet (40 mg total) by mouth daily.  Dispense: 90 tablet; Refill: 3  3. Ischemic heart disease screen   - Lipid panel - CBC with Differential/Platelet - POCT urinalysis dipstick - Comprehensive metabolic panel  4. Medicare annual wellness visit, subsequent See above  5. Encounter for Medicare annual wellness exam     - Pneumococcal polysaccharide vaccine 23-valent greater than or equal to 2yo subcutaneous/IM  Ann Held, DO  09/27/2016

## 2016-09-28 LAB — CBC WITH DIFFERENTIAL/PLATELET
BASOS PCT: 0.3 % (ref 0.0–3.0)
Basophils Absolute: 0 10*3/uL (ref 0.0–0.1)
EOS ABS: 0.5 10*3/uL (ref 0.0–0.7)
EOS PCT: 6.3 % — AB (ref 0.0–5.0)
HEMATOCRIT: 35.8 % — AB (ref 36.0–46.0)
HEMOGLOBIN: 11.7 g/dL — AB (ref 12.0–15.0)
LYMPHS PCT: 42.2 % (ref 12.0–46.0)
Lymphs Abs: 3.6 10*3/uL (ref 0.7–4.0)
MCHC: 32.7 g/dL (ref 30.0–36.0)
MCV: 70.1 fl — ABNORMAL LOW (ref 78.0–100.0)
MONO ABS: 0.6 10*3/uL (ref 0.1–1.0)
Monocytes Relative: 7.2 % (ref 3.0–12.0)
Neutro Abs: 3.8 10*3/uL (ref 1.4–7.7)
Neutrophils Relative %: 44 % (ref 43.0–77.0)
Platelets: 393 10*3/uL (ref 150.0–400.0)
RBC: 5.1 Mil/uL (ref 3.87–5.11)
RDW: 17.8 % — ABNORMAL HIGH (ref 11.5–15.5)
WBC: 8.6 10*3/uL (ref 4.0–10.5)

## 2016-09-28 LAB — COMPREHENSIVE METABOLIC PANEL
ALBUMIN: 4.3 g/dL (ref 3.5–5.2)
ALK PHOS: 86 U/L (ref 39–117)
ALT: 17 U/L (ref 0–35)
AST: 22 U/L (ref 0–37)
BILIRUBIN TOTAL: 0.8 mg/dL (ref 0.2–1.2)
BUN: 10 mg/dL (ref 6–23)
CALCIUM: 9.6 mg/dL (ref 8.4–10.5)
CO2: 29 mEq/L (ref 19–32)
CREATININE: 0.99 mg/dL (ref 0.40–1.20)
Chloride: 107 mEq/L (ref 96–112)
GFR: 59.3 mL/min — ABNORMAL LOW (ref >60.00)
Glucose, Bld: 93 mg/dL (ref 70–99)
Potassium: 4.2 mEq/L (ref 3.5–5.1)
SODIUM: 143 meq/L (ref 135–145)
TOTAL PROTEIN: 7.3 g/dL (ref 6.0–8.3)

## 2016-09-28 LAB — LIPID PANEL
CHOLESTEROL: 192 mg/dL (ref 0–200)
HDL: 44.2 mg/dL (ref >39.00)
LDL Cholesterol: 124 mg/dL — ABNORMAL HIGH (ref 0–99)
NONHDL: 147.52
Total CHOL/HDL Ratio: 4
Triglycerides: 118 mg/dL (ref 0.0–149.0)
VLDL: 23.6 mg/dL (ref 0.0–40.0)

## 2016-09-29 ENCOUNTER — Other Ambulatory Visit: Payer: Self-pay

## 2016-09-29 ENCOUNTER — Telehealth: Payer: Self-pay | Admitting: Family Medicine

## 2016-09-29 NOTE — Telephone Encounter (Signed)
Spoke with pt, pt states arm is painful since she had the pneumonia injection at her last ov. Pt denied SOB, chest pain, and dizziness. Advised pt she should come in to have someone look at the site, pt declined. Advised pt to notify provider if symptoms get worst. Pt had no further questions or concerns. LB

## 2016-09-29 NOTE — Telephone Encounter (Signed)
Patient called stating that she received the pneumonia shot on 09/27/16. She said her whole are hurt that day, the next day her arm hurt to touch, and today her arm is all red and puffy at the injection site. Please advise  Patient phone: [phone] or [phone]

## 2016-10-05 ENCOUNTER — Telehealth: Payer: Self-pay

## 2016-10-05 DIAGNOSIS — D509 Iron deficiency anemia, unspecified: Secondary | ICD-10-CM

## 2016-10-05 NOTE — Telephone Encounter (Signed)
Spoke with pt, pt states she understand results, medication instructions, and follow up appointment with lab. Future labs order, and set up appointment for lab work. Pt had no further questions or concerns. LB

## 2016-10-11 DIAGNOSIS — L570 Actinic keratosis: Secondary | ICD-10-CM | POA: Diagnosis not present

## 2016-11-08 ENCOUNTER — Other Ambulatory Visit (INDEPENDENT_AMBULATORY_CARE_PROVIDER_SITE_OTHER): Payer: Medicare Other

## 2016-11-08 DIAGNOSIS — D509 Iron deficiency anemia, unspecified: Secondary | ICD-10-CM | POA: Diagnosis not present

## 2016-11-08 LAB — CBC WITH DIFFERENTIAL/PLATELET
BASOS PCT: 0.4 % (ref 0.0–3.0)
Basophils Absolute: 0 10*3/uL (ref 0.0–0.1)
EOS ABS: 0.3 10*3/uL (ref 0.0–0.7)
Eosinophils Relative: 3.5 % (ref 0.0–5.0)
HCT: 38.9 % (ref 36.0–46.0)
Hemoglobin: 12.6 g/dL (ref 12.0–15.0)
Lymphocytes Relative: 41.4 % (ref 12.0–46.0)
Lymphs Abs: 3.3 10*3/uL (ref 0.7–4.0)
MCHC: 32.4 g/dL (ref 30.0–36.0)
MCV: 73.2 fl — ABNORMAL LOW (ref 78.0–100.0)
MONO ABS: 0.7 10*3/uL (ref 0.1–1.0)
Monocytes Relative: 8.6 % (ref 3.0–12.0)
NEUTROS ABS: 3.6 10*3/uL (ref 1.4–7.7)
Neutrophils Relative %: 46.1 % (ref 43.0–77.0)
PLATELETS: 385 10*3/uL (ref 150.0–400.0)
RBC: 5.31 Mil/uL — ABNORMAL HIGH (ref 3.87–5.11)
RDW: 20.8 % — AB (ref 11.5–15.5)
WBC: 7.9 10*3/uL (ref 4.0–10.5)

## 2016-11-08 LAB — IBC PANEL
IRON: 33 ug/dL — AB (ref 42–145)
Saturation Ratios: 6.1 % — ABNORMAL LOW (ref 20.0–50.0)
TRANSFERRIN: 389 mg/dL — AB (ref 212.0–360.0)

## 2016-11-08 LAB — FERRITIN: Ferritin: 5.8 ng/mL — ABNORMAL LOW (ref 10.0–291.0)

## 2016-12-15 ENCOUNTER — Ambulatory Visit: Payer: Medicare Other | Attending: Family Medicine | Admitting: Audiology

## 2016-12-15 DIAGNOSIS — H903 Sensorineural hearing loss, bilateral: Secondary | ICD-10-CM | POA: Insufficient documentation

## 2016-12-15 DIAGNOSIS — H9193 Unspecified hearing loss, bilateral: Secondary | ICD-10-CM | POA: Diagnosis not present

## 2016-12-15 DIAGNOSIS — H93213 Auditory recruitment, bilateral: Secondary | ICD-10-CM | POA: Diagnosis not present

## 2016-12-15 DIAGNOSIS — H93299 Other abnormal auditory perceptions, unspecified ear: Secondary | ICD-10-CM | POA: Diagnosis not present

## 2016-12-15 DIAGNOSIS — H9313 Tinnitus, bilateral: Secondary | ICD-10-CM | POA: Insufficient documentation

## 2016-12-15 NOTE — Procedures (Signed)
[facility]  [place], [place] [address]  [phone]   Audiological Evaluation  Patient Name: Brianna Farrell   Status: Outpatient   DOB: 15-Apr-1951    Diagnosis: Hearing Loss MRN: [id number] Date:  12/15/2016     Referent: Ann Held, DO  History: Brianna Farrell was seen for an audiological evaluation. Uses the left ear on the telephone. Primary Concern: Hearing loss with occational high pithed tinnitus but with constant lower pitched white noise.  Has occasional "clicking sounds in ears". History of hearing problems: N History of ear infections:  Y a child had many. Not as many as an adult but did have one last year. History of ear surgery or "tubes" : N History of dizziness/vertigo:   N History of balance issues:  Sometimes in the morning, but gets better after a while.  Tinnitus: "White noise sound" started getting worse a couple of years ago but is getting worse. 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 History of hypertension: N History of diabetes:  N Family history of hearing loss:  Mother had "bad hearing", paternal grandmother had severe hearing loss.     Evaluation: Conventional pure tone audiometry from 250Hz  - 8000Hz  with using insert earphones.  Hearing Thresholds show symmetrical hearing thresholds of 20 dBHL at 250Hz -500Hz ; 10 dBHL at 1000Hz ; 35-40 dBHL from 2000Hz  - 4000Hz  and 40-45 dBHL at 8000Hz . The hearing loss appears sensorineural with recruitment. Uncomfortable Loudness Levels are 85 dBHL on the right and 90dBHL on the left. Reliability is good Speech reception levels (repeating words near threshold) using recorded spondee word lists:  Right ear: 25 dBHL.  Left ear:  20 dBHL Word recognition (at comfortably loud volumes) using recorded NU-6 word lists at 60 dBHL, in quiet.  Right ear: 100%.  Left ear:   100% Word recognition in minimal background  noise:  +5 dBHL  Right ear: 68%                              Left ear:  50%  Tympanometry (middle ear function) shows normal middle ear volume with hypercompliant tympanic membrane movement (Type A d) bilaterally.  Ipsilateral acoustic reflex was present on the left side at 1000Hz  but was not reported because the volume was uncomfortably loud.  Otoscopic exam appears within normal limits bilaterally, without redness.    Tinnitus matching appeared to be approximately 44  dBHL using speech noise.  The tinnitus DOES NOT appear to suppress.    CONCLUSION:      Brianna Farrell has a symmetrical hearing loss that is normal to borderline normal in the low frequency hearies with a mild high frequency sensorineural hearing loss bilaterally.  Word recognition is excellent in quiet, but drops to poor in minimal background noise in each ear.  Recruitment, an intolerance to sound sounds related to the sensorineural hearing loss is present in each ear. Brianna Farrell has hypercompliant tympanic membrane movement which may be related to her reported history of "allergies" and may also be related to the "clicking sound" that she sometimes hears.    Brianna Farrell has white noise type tinnitus with loudness equivalent to a whisper, so that it may be audible in many settings.  As discussed there are hearing aids that also have tinnitus masking included.  A hearing aid evaluation would be needed. Brianna Farrell stated that she was not interested at this time.  Please note that we do not dispense hearing aids at [facility], to the amplifciation discussion was for information only.    Also discussed were smart phone apps that Brianna Farrell may find helpful for masking tinnitus or as a conversational amplifier.  The test results were discussed and Brianna Farrell counseled.   RECOMMENDATIONS: 1.   Referral to an ENT for further evaluation of the a) hypercompliance tympanic membrane movement and b) tinnitus (white noise and "clicking  sound). 2.   Although discussed, but not interested at this time, a hearing aid evaluation with a tinnitus masking was recommended.  3. To minimize the adverse effects of tinnitus 1) avoid quiet  2) use noise maskers at home such as a sound machine, quiet music, a fan or other background noise at a volume just loud enough to mask the high pitched tinnitus. 3) If the tinnitus becomes more bothersome, adversely affecting your sleep or concentration, contact your physician,  seek additional medical help by an ENT for further treatment of your tinnitus.  In addition, the [employer], created for tinnitus management was discussed.  4.  Strategies that help improve hearing in background noise include: A) Face the speaker directly. Optimal is having the speakers face well - lit.  Unless amplified, being within 3-6 feet of the speaker will enhance word recognition. B) Avoid having the speaker back-lit as this will minimize the ability to use cues from lip-reading, facial expression and gestures. C)  Word recognition is poorer in background noise. For optimal word recognition, turn off the TV, radio or noisy fan when engaging in conversation. In a restaurant, try to sit away from noise sources and close to the primary speaker.  D)  Ask for topic clarification from time to time in order to remain in the conversation.  Most people don't mind repeating or clarifying a point when asked.  If needed, explain the difficulty hearing in background noise or hearing loss. 5.   Use hearing protection during noisy activities such as using a weed eater, moving the lawn, shooting, loud music, etc.    Musician's plugs, are available from [employer] for music related hearing protection because there is no distortion.  Other hearing protection, such as sponge plugs (available at pharmacies) or earmuffs (available at sporting goods stores or department stores such as Paediatric nurse) are useful for noisy activities and venues. 6.    Monitor hearing at least annually to rule out progressive hearing loss.  Request an earlier hearing evaluation for concerns.     Franco Duley L. Heide Spark, Au.D., CCC-A Doctor of Audiology 12/15/2016

## 2017-01-25 DIAGNOSIS — Z01419 Encounter for gynecological examination (general) (routine) without abnormal findings: Secondary | ICD-10-CM | POA: Diagnosis not present

## 2017-01-25 DIAGNOSIS — M8588 Other specified disorders of bone density and structure, other site: Secondary | ICD-10-CM | POA: Diagnosis not present

## 2017-01-25 DIAGNOSIS — N958 Other specified menopausal and perimenopausal disorders: Secondary | ICD-10-CM | POA: Diagnosis not present

## 2017-01-25 DIAGNOSIS — Z6830 Body mass index (BMI) 30.0-30.9, adult: Secondary | ICD-10-CM | POA: Diagnosis not present

## 2017-01-25 DIAGNOSIS — Z1231 Encounter for screening mammogram for malignant neoplasm of breast: Secondary | ICD-10-CM | POA: Diagnosis not present

## 2017-01-25 LAB — HM DEXA SCAN

## 2017-01-31 DIAGNOSIS — L570 Actinic keratosis: Secondary | ICD-10-CM | POA: Diagnosis not present

## 2017-02-09 DIAGNOSIS — H40059 Ocular hypertension, unspecified eye: Secondary | ICD-10-CM | POA: Diagnosis not present

## 2017-02-09 DIAGNOSIS — H52209 Unspecified astigmatism, unspecified eye: Secondary | ICD-10-CM | POA: Diagnosis not present

## 2017-02-09 DIAGNOSIS — H353131 Nonexudative age-related macular degeneration, bilateral, early dry stage: Secondary | ICD-10-CM | POA: Diagnosis not present

## 2017-02-09 DIAGNOSIS — Z8669 Personal history of other diseases of the nervous system and sense organs: Secondary | ICD-10-CM | POA: Diagnosis not present

## 2017-02-09 DIAGNOSIS — H2513 Age-related nuclear cataract, bilateral: Secondary | ICD-10-CM | POA: Diagnosis not present

## 2017-02-14 DIAGNOSIS — M859 Disorder of bone density and structure, unspecified: Secondary | ICD-10-CM | POA: Diagnosis not present

## 2017-03-07 DIAGNOSIS — D492 Neoplasm of unspecified behavior of bone, soft tissue, and skin: Secondary | ICD-10-CM | POA: Diagnosis not present

## 2017-03-07 DIAGNOSIS — B078 Other viral warts: Secondary | ICD-10-CM | POA: Diagnosis not present

## 2017-03-07 DIAGNOSIS — L570 Actinic keratosis: Secondary | ICD-10-CM | POA: Diagnosis not present

## 2017-03-14 ENCOUNTER — Ambulatory Visit (INDEPENDENT_AMBULATORY_CARE_PROVIDER_SITE_OTHER): Payer: Medicare Other | Admitting: Family Medicine

## 2017-03-14 ENCOUNTER — Encounter: Payer: Self-pay | Admitting: Family Medicine

## 2017-03-14 VITALS — BP 126/78 | HR 66 | Temp 98.4°F | Resp 16 | Ht 64.0 in | Wt 168.8 lb

## 2017-03-14 DIAGNOSIS — S80861A Insect bite (nonvenomous), right lower leg, initial encounter: Secondary | ICD-10-CM | POA: Diagnosis not present

## 2017-03-14 DIAGNOSIS — W57XXXA Bitten or stung by nonvenomous insect and other nonvenomous arthropods, initial encounter: Secondary | ICD-10-CM | POA: Diagnosis not present

## 2017-03-14 LAB — COMPREHENSIVE METABOLIC PANEL
ALT: 18 U/L (ref 0–35)
AST: 21 U/L (ref 0–37)
Albumin: 4.2 g/dL (ref 3.5–5.2)
Alkaline Phosphatase: 79 U/L (ref 39–117)
BUN: 14 mg/dL (ref 6–23)
CALCIUM: 9.5 mg/dL (ref 8.4–10.5)
CHLORIDE: 108 meq/L (ref 96–112)
CO2: 27 mEq/L (ref 19–32)
Creatinine, Ser: 1 mg/dL (ref 0.40–1.20)
GFR: 58.54 mL/min — ABNORMAL LOW (ref >60.00)
Glucose, Bld: 107 mg/dL — ABNORMAL HIGH (ref 70–99)
POTASSIUM: 4.1 meq/L (ref 3.5–5.1)
SODIUM: 142 meq/L (ref 135–145)
Total Bilirubin: 1.1 mg/dL (ref 0.2–1.2)
Total Protein: 7 g/dL (ref 6.0–8.3)

## 2017-03-14 LAB — CBC WITH DIFFERENTIAL/PLATELET
BASOS PCT: 0.5 % (ref 0.0–3.0)
Basophils Absolute: 0 10*3/uL (ref 0.0–0.1)
EOS PCT: 2.4 % (ref 0.0–5.0)
Eosinophils Absolute: 0.1 10*3/uL (ref 0.0–0.7)
HEMATOCRIT: 42.5 % (ref 36.0–46.0)
HEMOGLOBIN: 14.1 g/dL (ref 12.0–15.0)
LYMPHS PCT: 49.9 % — AB (ref 12.0–46.0)
Lymphs Abs: 3.1 10*3/uL (ref 0.7–4.0)
MCHC: 33.1 g/dL (ref 30.0–36.0)
MCV: 83.6 fl (ref 78.0–100.0)
MONOS PCT: 7.7 % (ref 3.0–12.0)
Monocytes Absolute: 0.5 10*3/uL (ref 0.1–1.0)
Neutro Abs: 2.4 10*3/uL (ref 1.4–7.7)
Neutrophils Relative %: 39.5 % — ABNORMAL LOW (ref 43.0–77.0)
Platelets: 317 10*3/uL (ref 150.0–400.0)
RBC: 5.09 Mil/uL (ref 3.87–5.11)
RDW: 16 % — AB (ref 11.5–15.5)
WBC: 6.2 10*3/uL (ref 4.0–10.5)

## 2017-03-14 MED ORDER — DOXYCYCLINE HYCLATE 100 MG PO TABS: 100.0000 mg | ORAL_TABLET | Freq: Two times a day (BID) | ORAL | 0 refills | Status: DC

## 2017-03-14 NOTE — Patient Instructions (Signed)
Tick Bite Information Introduction Ticks are insects that attach themselves to the skin. There are many types of ticks. Common types include wood ticks and deer ticks. Sometimes, ticks carry diseases that can make a person very ill. The most common places for ticks to attach themselves are the scalp, neck, armpits, waist, and groin. HOW CAN YOU PREVENT TICK BITES? Take these steps to help prevent tick bites when you are outdoors:  Wear long sleeves and long pants.  Wear white clothes so you can see ticks more easily.  Tuck your pant legs into your socks.  If walking on a trail, stay in the middle of the trail to avoid brushing against bushes.  Avoid walking through areas with long grass.  Put bug spray on all skin that is showing and along boot tops, pant legs, and sleeve cuffs.  Check clothes, hair, and skin often and before going inside.  Brush off any ticks that are not attached.  Take a shower or bath as soon as possible after being outdoors. HOW SHOULD YOU REMOVE A TICK? Ticks should be removed as soon as possible to help prevent diseases. 1. If latex gloves are available, put them on before trying to remove a tick. 2. Use tweezers to grasp the tick as close to the skin as possible. You may also use curved forceps or a tick removal tool. Grasp the tick as close to its head as possible. Avoid grasping the tick on its body. 3. Pull gently upward until the tick lets go. Do not twist the tick or jerk it suddenly. This may break off the tick's head or mouth parts. 4. Do not squeeze or crush the tick's body. This could force disease-carrying fluids from the tick into your body. 5. After the tick is removed, wash the bite area and your hands with soap and water or alcohol. 6. Apply a small amount of antiseptic cream or ointment to the bite site. 7. Wash any tools that were used. Do not try to remove a tick by applying a hot match, petroleum jelly, or fingernail polish to the tick. These  methods do not work. They may also increase the chances of disease being spread from the tick bite. WHEN SHOULD YOU SEEK HELP? Contact your health care provider if you are unable to remove a tick or if a part of the tick breaks off in the skin. After a tick bite, you need to watch for signs and symptoms of diseases that can be spread by ticks. Contact your health care provider if you develop any of the following:  Fever.  Rash.  Redness and puffiness (swelling) in the area of the tick bite.  Tender, puffy lymph glands.  Watery poop (diarrhea).  Weight loss.  Cough.  Feeling more tired than normal (fatigue).  Muscle, joint, or bone pain.  Belly (abdominal) pain.  Headache.  Change in your level of consciousness.  Trouble walking or moving your legs.  Loss of feeling (numbness) in the legs.  Loss of movement (paralysis).  Shortness of breath.  Confusion.  Throwing up (vomiting) many times. This information is not intended to replace advice given to you by your health care provider. Make sure you discuss any questions you have with your health care provider. Document Released: 01/04/2010 Document Revised: 03/17/2016 Document Reviewed: 03/20/2013 Elsevier Interactive Patient Education  2017 Elsevier Inc.  

## 2017-03-14 NOTE — Progress Notes (Signed)
Patient ID: Brianna Farrell, female   DOB: [birth year], 66 y.o.   MRN: [id number]     Subjective:  I acted as a Education administrator for Dr. Carollee Herter.  Guerry Bruin, [place]   Patient ID: Brianna Farrell, female    DOB: [birth year], 66 y.o.   MRN: [id number]  Chief Complaint  Patient presents with  . Insect Bite    tick    HPI  Patient is in today for two tick bites.  Happened possibly 1.5 to 2 weeks ago.  She has two bites on her upper left leg and lower right leg.  The one on the lower right leg has a ring around it.  No fever, nausea, or chills.  She has some itching on the lower left leg.    Patient Care Team: Carollee Herter, Alferd Apa, DO as PCP - General (Family Medicine) Marylynn Pearson, MD as Consulting Physician (Obstetrics and Gynecology) Marylynn Pearson, MD as Consulting Physician (Obstetrics and Gynecology) Lavonna Monarch, MD as Consulting Physician (Dermatology)   Past Medical History:  Diagnosis Date  . Anemia   . Arthritis   . Chicken pox   . GERD (gastroesophageal reflux disease)   . History of stomach ulcers   . HTN (hypertension)   . Hyperlipidemia   . IBS (irritable bowel syndrome)   . Macular degeneration   . Migraines   . Pneumonia   . Urine incontinence     Past Surgical History:  Procedure Laterality Date  . ABDOMINAL HYSTERECTOMY     Still has Ovaries  . APPENDECTOMY  1985  . DILATION AND CURETTAGE OF UTERUS  1984  . SHOULDER SURGERY Right    Shoulder Manipulation  . TONSILLECTOMY AND ADENOIDECTOMY  1953    Family History  Problem Relation Age of Onset  . Arthritis Mother   . Osteoarthritis Mother   . Rheum arthritis Mother   . Ovarian cancer Cousin        x 2  . Breast cancer Maternal Aunt   . Hyperlipidemia Father   . Hyperlipidemia Brother   . Heart disease Father   . Heart disease Brother   . Stroke Paternal Aunt   . Kidney disease Mother   . Breast cancer Paternal Aunt     Social History   Social History  . Marital status: Widowed    Spouse name:  N/A  . Number of children: 1  . Years of education: N/A   Occupational History  . retired    Social History Main Topics  . Smoking status: Never Smoker  . Smokeless tobacco: Never Used  . Alcohol use No  . Drug use: No  . Sexual activity: Not on file   Other Topics Concern  . Not on file   Social History Narrative  . No narrative on file    Outpatient Medications Prior to Visit  Medication Sig Dispense Refill  . cetirizine (ZYRTEC) 10 MG tablet Take 5 mg by mouth as needed.     Marland Kitchen co-enzyme Q-10 30 MG capsule Take 30 mg by mouth 3 (three) times daily.    . hyoscyamine (LEVSIN/SL) 0.125 MG SL tablet Place 1 tablet (0.125 mg total) under the tongue every 4 (four) hours as needed. 30 tablet 0  . loperamide (IMODIUM A-D) 2 MG tablet Take 2 mg by mouth 4 (four) times daily as needed for diarrhea or loose stools.    Marland Kitchen MEGARED OMEGA-3 KRILL OIL PO Take 350 mg by mouth.    . Multiple Vitamins-Minerals (PRESERVISION AREDS 2) CAPS  Take by mouth.    . tetrahydrozoline-zinc (VISINE-AC) 0.05-0.25 % ophthalmic solution 2 drops 3 (three) times daily as needed.    . pantoprazole (PROTONIX) 40 MG tablet Take 1 tablet (40 mg total) by mouth daily. 90 tablet 3   No facility-administered medications prior to visit.     Allergies  Allergen Reactions  . Ibuprofen Anaphylaxis    REACTION: hives, tongue edema  . Nsaids Anaphylaxis    REACTION: hives, tongue edema    Review of Systems  Constitutional: Negative for chills, fever and malaise/fatigue.  HENT: Negative for congestion.   Eyes: Negative for blurred vision.  Respiratory: Negative for cough and shortness of breath.   Cardiovascular: Negative for chest pain, palpitations and leg swelling.  Gastrointestinal: Negative for nausea and vomiting.  Musculoskeletal: Negative for back pain.  Skin: Negative for rash.       2 tick bites one on upper left leg and lower right leg.  Neurological: Negative for loss of consciousness and headaches.         Objective:    Physical Exam  Constitutional: She is oriented to person, place, and time. She appears well-developed and well-nourished.  HENT:  Head: Normocephalic and atraumatic.  Eyes: Conjunctivae and EOM are normal.  Neck: Normal range of motion. Neck supple. No JVD present. Carotid bruit is not present. No thyromegaly present.  Cardiovascular: Normal rate, regular rhythm and normal heart sounds.   No murmur heard. Pulmonary/Chest: Effort normal and breath sounds normal. No respiratory distress. She has no wheezes. She has no rales. She exhibits no tenderness.  Musculoskeletal: She exhibits no edema.  Neurological: She is alert and oriented to person, place, and time.  Skin:     Psychiatric: She has a normal mood and affect.  Nursing note and vitals reviewed.   BP 126/78 (BP Location: Left Arm, Cuff Size: Normal)   Pulse 66   Temp 98.4 F (36.9 C) (Oral)   Resp 16   Ht 5\' 4"  (1.626 m)   Wt 168 lb 12.8 oz (76.6 kg)   SpO2 97%   BMI 28.97 kg/m  Wt Readings from Last 3 Encounters:  03/14/17 168 lb 12.8 oz (76.6 kg)  09/27/16 175 lb (79.4 kg)  07/16/15 167 lb 12.8 oz (76.1 kg)   BP Readings from Last 3 Encounters:  03/14/17 126/78  09/27/16 (!) 152/78  07/16/15 123/82     Immunization History  Administered Date(s) Administered  . Influenza, High Dose Seasonal PF 08/23/2016  . Influenza-Unspecified 08/24/2014, 07/25/2015  . Pneumococcal Conjugate-13 01/05/2015  . Pneumococcal Polysaccharide-23 09/27/2016  . Tdap 02/06/2015  . Zoster 09/30/2014    Health Maintenance  Topic Date Due  . INFLUENZA VACCINE  05/24/2017  . MAMMOGRAM  11/10/2017  . COLONOSCOPY  06/11/2024  . TETANUS/TDAP  02/05/2025  . DEXA SCAN  Completed  . Hepatitis C Screening  Completed  . PNA vac Low Risk Adult  Completed    Lab Results  Component Value Date   WBC 6.2 03/14/2017   HGB 14.1 03/14/2017   HCT 42.5 03/14/2017   PLT 317.0 03/14/2017   GLUCOSE 107 (H) 03/14/2017    CHOL 192 09/27/2016   TRIG 118.0 09/27/2016   HDL 44.20 09/27/2016   LDLCALC 124 (H) 09/27/2016   ALT 18 03/14/2017   AST 21 03/14/2017   NA 142 03/14/2017   K 4.1 03/14/2017   CL 108 03/14/2017   CREATININE 1.00 03/14/2017   BUN 14 03/14/2017   CO2 27 03/14/2017   TSH  1.46 04/01/2014    Lab Results  Component Value Date   TSH 1.46 04/01/2014   Lab Results  Component Value Date   WBC 6.2 03/14/2017   HGB 14.1 03/14/2017   HCT 42.5 03/14/2017   MCV 83.6 03/14/2017   PLT 317.0 03/14/2017   Lab Results  Component Value Date   NA 142 03/14/2017   K 4.1 03/14/2017   CO2 27 03/14/2017   GLUCOSE 107 (H) 03/14/2017   BUN 14 03/14/2017   CREATININE 1.00 03/14/2017   BILITOT 1.1 03/14/2017   ALKPHOS 79 03/14/2017   AST 21 03/14/2017   ALT 18 03/14/2017   PROT 7.0 03/14/2017   ALBUMIN 4.2 03/14/2017   CALCIUM 9.5 03/14/2017   GFR 58.54 (L) 03/14/2017   Lab Results  Component Value Date   CHOL 192 09/27/2016   Lab Results  Component Value Date   HDL 44.20 09/27/2016   Lab Results  Component Value Date   LDLCALC 124 (H) 09/27/2016   Lab Results  Component Value Date   TRIG 118.0 09/27/2016   Lab Results  Component Value Date   CHOLHDL 4 09/27/2016   No results found for: HGBA1C       Assessment & Plan:   Problem List Items Addressed This Visit    None    Visit Diagnoses    Tick bite of right lower leg, initial encounter    -  Primary   Relevant Medications   doxycycline (VIBRA-TABS) 100 MG tablet   Other Relevant Orders   Comprehensive metabolic panel (Completed)   CBC with Differential/Platelet (Completed)   Lyme Aby, Western Blot IgG & IgM w/bands   Rocky mtn spotted fvr abs pnl(IgG+IgM)      I have discontinued Ms. Sanko pantoprazole. I am also having her start on doxycycline. Additionally, I am having her maintain her cetirizine, loperamide, tetrahydrozoline-zinc, hyoscyamine, PRESERVISION AREDS 2, co-enzyme Q-10, and MEGARED OMEGA-3  KRILL OIL PO.  Meds ordered this encounter  Medications  . doxycycline (VIBRA-TABS) 100 MG tablet    Sig: Take 1 tablet (100 mg total) by mouth 2 (two) times daily.    Dispense:  20 tablet    Refill:  0    CMA served as scribe during this visit. History, Physical and Plan performed by medical provider. Documentation and orders reviewed and attested to.  Ann Held, DO

## 2017-03-15 LAB — ROCKY MTN SPOTTED FVR ABS PNL(IGG+IGM)
RMSF IGM: NOT DETECTED
RMSF IgG: NOT DETECTED

## 2017-03-16 LAB — LYME ABY, WSTRN BLT IGG & IGM W/BANDS
B burgdorferi IgG Abs (IB): NEGATIVE
B burgdorferi IgM Abs (IB): NEGATIVE
LYME DISEASE 23 KD IGG: NONREACTIVE
LYME DISEASE 28 KD IGG: NONREACTIVE
LYME DISEASE 30 KD IGG: NONREACTIVE
LYME DISEASE 41 KD IGM: NONREACTIVE
LYME DISEASE 45 KD IGG: NONREACTIVE
LYME DISEASE 93 KD IGG: NONREACTIVE
Lyme Disease 18 kD IgG: NONREACTIVE
Lyme Disease 23 kD IgM: NONREACTIVE
Lyme Disease 39 kD IgG: NONREACTIVE
Lyme Disease 39 kD IgM: NONREACTIVE
Lyme Disease 41 kD IgG: NONREACTIVE
Lyme Disease 58 kD IgG: NONREACTIVE
Lyme Disease 66 kD IgG: NONREACTIVE

## 2017-08-30 DIAGNOSIS — Z23 Encounter for immunization: Secondary | ICD-10-CM | POA: Diagnosis not present

## 2017-09-05 DIAGNOSIS — L821 Other seborrheic keratosis: Secondary | ICD-10-CM | POA: Diagnosis not present

## 2017-09-05 DIAGNOSIS — L905 Scar conditions and fibrosis of skin: Secondary | ICD-10-CM | POA: Diagnosis not present

## 2017-09-05 DIAGNOSIS — L820 Inflamed seborrheic keratosis: Secondary | ICD-10-CM | POA: Diagnosis not present

## 2017-09-05 DIAGNOSIS — L570 Actinic keratosis: Secondary | ICD-10-CM | POA: Diagnosis not present

## 2017-09-05 DIAGNOSIS — D492 Neoplasm of unspecified behavior of bone, soft tissue, and skin: Secondary | ICD-10-CM | POA: Diagnosis not present

## 2017-09-05 DIAGNOSIS — L308 Other specified dermatitis: Secondary | ICD-10-CM | POA: Diagnosis not present

## 2017-09-28 NOTE — Progress Notes (Deleted)
Subjective:   Brianna Farrell is a 66 y.o. female who presents for Medicare Annual (Subsequent) preventive examination.  Review of Systems:  No ROS.  Medicare Wellness Visit. Additional risk factors are reflected in the social history.   Sleep patterns:  Home Safety/Smoke Alarms: Feels safe in home. Smoke alarms in place.   Female:   Pap-       Mammo-       Dexa scan-        CCS- Last 06/11/14: Recall 10 yrs  Objective:     Vitals: There were no vitals taken for this visit.  There is no height or weight on file to calculate BMI.  Advanced Directives 09/27/2016  Does Patient Have a Medical Advance Directive? Yes  Type of Paramedic of [place];Living will  Does patient want to make changes to medical advance directive? No - Patient declined  Copy of [place] in Chart? No - copy requested    Tobacco Social History   Tobacco Use  Smoking Status Never Smoker  Smokeless Tobacco Never Used     Counseling given: Not Answered   Clinical Intake:       Past Medical History:  Diagnosis Date  . Anemia   . Arthritis   . Chicken pox   . GERD (gastroesophageal reflux disease)   . History of stomach ulcers   . HTN (hypertension)   . Hyperlipidemia   . IBS (irritable bowel syndrome)   . Macular degeneration   . Migraines   . Pneumonia   . Urine incontinence    Past Surgical History:  Procedure Laterality Date  . ABDOMINAL HYSTERECTOMY     Still has Ovaries  . APPENDECTOMY  1985  . DILATION AND CURETTAGE OF UTERUS  1984  . SHOULDER SURGERY Right    Shoulder Manipulation  . TONSILLECTOMY AND ADENOIDECTOMY  1953   Family History  Problem Relation Age of Onset  . Arthritis Mother   . Osteoarthritis Mother   . Rheum arthritis Mother   . Ovarian cancer Cousin        x 2  . Breast cancer Maternal Aunt   . Hyperlipidemia Father   . Hyperlipidemia Brother   . Heart disease Father   . Heart disease Brother   . Stroke  Paternal Aunt   . Kidney disease Mother   . Breast cancer Paternal Aunt    Social History   Socioeconomic History  . Marital status: Widowed    Spouse name: Not on file  . Number of children: 1  . Years of education: Not on file  . Highest education level: Not on file  Social Needs  . Financial resource strain: Not on file  . Food insecurity - worry: Not on file  . Food insecurity - inability: Not on file  . Transportation needs - medical: Not on file  . Transportation needs - non-medical: Not on file  Occupational History  . Occupation: retired  Tobacco Use  . Smoking status: Never Smoker  . Smokeless tobacco: Never Used  Substance and Sexual Activity  . Alcohol use: No  . Drug use: No  . Sexual activity: Not on file  Other Topics Concern  . Not on file  Social History Narrative  . Not on file    Outpatient Encounter Medications as of 10/03/2017  Medication Sig  . cetirizine (ZYRTEC) 10 MG tablet Take 5 mg by mouth as needed.   Marland Kitchen co-enzyme Q-10 30 MG capsule Take 30  mg by mouth 3 (three) times daily.  Marland Kitchen doxycycline (VIBRA-TABS) 100 MG tablet Take 1 tablet (100 mg total) by mouth 2 (two) times daily.  . hyoscyamine (LEVSIN/SL) 0.125 MG SL tablet Place 1 tablet (0.125 mg total) under the tongue every 4 (four) hours as needed.  . loperamide (IMODIUM A-D) 2 MG tablet Take 2 mg by mouth 4 (four) times daily as needed for diarrhea or loose stools.  Marland Kitchen MEGARED OMEGA-3 KRILL OIL PO Take 350 mg by mouth.  . Multiple Vitamins-Minerals (PRESERVISION AREDS 2) CAPS Take by mouth.  . tetrahydrozoline-zinc (VISINE-AC) 0.05-0.25 % ophthalmic solution 2 drops 3 (three) times daily as needed.   No facility-administered encounter medications on file as of 10/03/2017.     Activities of Daily Living No flowsheet data found.   Patient Care Team: Carollee Herter, Alferd Apa, DO as PCP - General (Family Medicine) Marylynn Pearson, MD as Consulting Physician (Obstetrics and Gynecology) Marylynn Pearson, MD as Consulting Physician (Obstetrics and Gynecology) Lavonna Monarch, MD as Consulting Physician (Dermatology)    Assessment:    Physical assessment deferred to PCP.  Exercise Activities and Dietary recommendations   Diet (meal preparation, eat out, water intake, caffeinated beverages, dairy products, fruits and vegetables): {Desc; diets:16563} Breakfast: Lunch:  Dinner:      Goals    None     Fall Risk Fall Risk  09/29/2016 09/27/2016 07/16/2015 04/01/2014  Falls in the past year? Yes Yes Yes No  Comment Emmi Telephone Survey: data to providers prior to load - - -  Number falls in past yr: 2 or more 1 1 -  Comment Emmi Telephone Survey Actual Response = 2 - - -  Injury with Fall? No Yes No -  Comment - pt report 60 lb puppy knocked her down no injury but scraped knee. - -  Risk for fall due to : - - Other (Comment) -  Risk for fall due to: Comment - - The puppy knocked the patient down -   Depression Screen PHQ 2/9 Scores 09/27/2016 07/16/2015 04/01/2014  PHQ - 2 Score 0 0 0     Cognitive Function MMSE - Mini Mental State Exam 09/27/2016  Orientation to time 5  Orientation to Place 5  Registration 3  Attention/ Calculation 5  Recall 3  Language- name 2 objects 2  Language- repeat 1  Language- follow 3 step command 3  Language- read & follow direction 1  Write a sentence 1  Copy design 1  Total score 30        Immunization History  Administered Date(s) Administered  . Influenza, High Dose Seasonal PF 08/23/2016  . Influenza-Unspecified 08/24/2014, 07/25/2015  . Pneumococcal Conjugate-13 01/05/2015  . Pneumococcal Polysaccharide-23 09/27/2016  . Tdap 02/06/2015  . Zoster 09/30/2014   Screening Tests Health Maintenance  Topic Date Due  . INFLUENZA VACCINE  05/24/2017  . MAMMOGRAM  11/10/2017  . COLONOSCOPY  06/11/2024  . TETANUS/TDAP  02/05/2025  . DEXA SCAN  Completed  . Hepatitis C Screening  Completed  . PNA vac Low Risk Adult  Completed        Plan:   ***   I have personally reviewed and noted the following in the patient's chart:   . Medical and social history . Use of alcohol, tobacco or illicit drugs  . Current medications and supplements . Functional ability and status . Nutritional status . Physical activity . Advanced directives . List of other physicians . Hospitalizations, surgeries, and ER visits in previous  12 months . Vitals . Screenings to include cognitive, depression, and falls . Referrals and appointments  In addition, I have reviewed and discussed with patient certain preventive protocols, quality metrics, and best practice recommendations. A written personalized care plan for preventive services as well as general preventive health recommendations were provided to patient.     Shela Nevin, [place]  09/28/2017

## 2017-10-03 ENCOUNTER — Encounter: Payer: Medicare Other | Admitting: Family Medicine

## 2018-01-22 ENCOUNTER — Encounter: Payer: Medicare Other | Admitting: Family Medicine

## 2018-02-15 DIAGNOSIS — H2513 Age-related nuclear cataract, bilateral: Secondary | ICD-10-CM | POA: Diagnosis not present

## 2018-02-15 DIAGNOSIS — Z8669 Personal history of other diseases of the nervous system and sense organs: Secondary | ICD-10-CM | POA: Diagnosis not present

## 2018-02-15 DIAGNOSIS — H40059 Ocular hypertension, unspecified eye: Secondary | ICD-10-CM | POA: Diagnosis not present

## 2018-02-15 DIAGNOSIS — H52209 Unspecified astigmatism, unspecified eye: Secondary | ICD-10-CM | POA: Diagnosis not present

## 2018-02-15 DIAGNOSIS — H353131 Nonexudative age-related macular degeneration, bilateral, early dry stage: Secondary | ICD-10-CM | POA: Diagnosis not present

## 2018-02-20 DIAGNOSIS — Z124 Encounter for screening for malignant neoplasm of cervix: Secondary | ICD-10-CM | POA: Diagnosis not present

## 2018-02-20 DIAGNOSIS — Z01419 Encounter for gynecological examination (general) (routine) without abnormal findings: Secondary | ICD-10-CM | POA: Diagnosis not present

## 2018-02-20 DIAGNOSIS — Z6830 Body mass index (BMI) 30.0-30.9, adult: Secondary | ICD-10-CM | POA: Diagnosis not present

## 2018-02-20 DIAGNOSIS — Z1231 Encounter for screening mammogram for malignant neoplasm of breast: Secondary | ICD-10-CM | POA: Diagnosis not present

## 2018-02-20 LAB — HM PAP SMEAR: HM Pap smear: NEGATIVE

## 2018-02-22 LAB — HM MAMMOGRAPHY

## 2018-03-20 ENCOUNTER — Encounter: Payer: Medicare Other | Admitting: Family Medicine

## 2018-05-25 ENCOUNTER — Other Ambulatory Visit: Payer: Self-pay

## 2018-05-28 NOTE — Progress Notes (Signed)
Subjective:   Brianna Farrell is a 67 y.o. female who presents for Medicare Annual (Subsequent) preventive examination.  Review of Systems: No ROS.  Medicare Wellness Visit. Additional risk factors are reflected in the social history. Cardiac Risk Factors include: advanced age (>56men, >84 women);dyslipidemia;hypertension Sleep patterns:Sleeps about 5 hrs at night. Naps as needed.    Home Safety/Smoke Alarms: Feels safe in home. Smoke alarms in place.  Living environment; residence and Firearm Safety: Lives in 1 story with laundry in basement. Dtr is currently staying with her. Step over tub with grab rail.  Eye-yearly   Female:   Pap- Dr.Atkins 01/22/18-normal (per pt)       Mammo- 01/22/18-normal  Per pt Dexa scan-  01/22/17- osteopenia per pt      CCS- due 2025    Objective:     Vitals: BP 132/69 (BP Location: Left Arm, Patient Position: Sitting, Cuff Size: Normal)   Pulse 69   Ht 5' 3.3" (1.608 m)   Wt 173 lb (78.5 kg)   SpO2 98%   BMI 30.36 kg/m   Body mass index is 30.36 kg/m.  Advanced Directives 05/29/2018 09/27/2016  Does Patient Have a Medical Advance Directive? Yes Yes  Type of Paramedic of [place];Living will [place];Living will  Does patient want to make changes to medical advance directive? - No - Patient declined  Copy of [place] in Chart? No - copy requested No - copy requested    Tobacco Social History   Tobacco Use  Smoking Status Never Smoker  Smokeless Tobacco Never Used     Counseling given: Not Answered   Clinical Intake:     Pain : No/denies pain                 Past Medical History:  Diagnosis Date  . Anemia   . Arthritis   . Chicken pox   . GERD (gastroesophageal reflux disease)   . History of stomach ulcers   . HTN (hypertension)   . Hyperlipidemia   . IBS (irritable bowel syndrome)   . Macular degeneration   . Migraines   . Pneumonia   . Urine incontinence     Past Surgical History:  Procedure Laterality Date  . ABDOMINAL HYSTERECTOMY     Still has Ovaries  . APPENDECTOMY  1985  . DILATION AND CURETTAGE OF UTERUS  1984  . SHOULDER SURGERY Right    Shoulder Manipulation  . TONSILLECTOMY AND ADENOIDECTOMY  1953   Family History  Problem Relation Age of Onset  . Arthritis Mother   . Osteoarthritis Mother   . Rheum arthritis Mother   . Kidney disease Mother   . Ovarian cancer Cousin        x 2  . Breast cancer Maternal Aunt   . Hyperlipidemia Father   . Heart disease Father   . Hyperlipidemia Brother   . Heart disease Brother   . Stroke Paternal Aunt   . Breast cancer Paternal Aunt    Social History   Socioeconomic History  . Marital status: Widowed    Spouse name: Not on file  . Number of children: 1  . Years of education: Not on file  . Highest education level: Not on file  Occupational History  . Occupation: retired  Scientific laboratory technician  . Financial resource strain: Not on file  . Food insecurity:    Worry: Not on file    Inability: Not on file  . Transportation  needs:    Medical: Not on file    Non-medical: Not on file  Tobacco Use  . Smoking status: Never Smoker  . Smokeless tobacco: Never Used  Substance and Sexual Activity  . Alcohol use: No  . Drug use: No  . Sexual activity: Not on file  Lifestyle  . Physical activity:    Days per week: Not on file    Minutes per session: Not on file  . Stress: Not on file  Relationships  . Social connections:    Talks on phone: Not on file    Gets together: Not on file    Attends religious service: Not on file    Active member of club or organization: Not on file    Attends meetings of clubs or organizations: Not on file    Relationship status: Not on file  Other Topics Concern  . Not on file  Social History Narrative  . Not on file    Outpatient Encounter Medications as of 05/29/2018  Medication Sig  . [DISCONTINUED] cetirizine (ZYRTEC) 10 MG tablet Take 5 mg by  mouth as needed.   . [DISCONTINUED] co-enzyme Q-10 30 MG capsule Take 30 mg by mouth 3 (three) times daily.  . [DISCONTINUED] doxycycline (VIBRA-TABS) 100 MG tablet Take 1 tablet (100 mg total) by mouth 2 (two) times daily.  . [DISCONTINUED] hyoscyamine (LEVSIN/SL) 0.125 MG SL tablet Place 1 tablet (0.125 mg total) under the tongue every 4 (four) hours as needed.  . [DISCONTINUED] loperamide (IMODIUM A-D) 2 MG tablet Take 2 mg by mouth 4 (four) times daily as needed for diarrhea or loose stools.  . [DISCONTINUED] MEGARED OMEGA-3 KRILL OIL PO Take 350 mg by mouth.  . [DISCONTINUED] Multiple Vitamins-Minerals (PRESERVISION AREDS 2) CAPS Take by mouth.  . [DISCONTINUED] tetrahydrozoline-zinc (VISINE-AC) 0.05-0.25 % ophthalmic solution 2 drops 3 (three) times daily as needed.   No facility-administered encounter medications on file as of 05/29/2018.     Activities of Daily Living In your present state of health, do you have any difficulty performing the following activities: 05/29/2018  Hearing? N  Vision? N  Difficulty concentrating or making decisions? N  Walking or climbing stairs? N  Dressing or bathing? N  Doing errands, shopping? N  Preparing Food and eating ? N  Using the Toilet? N  In the past six months, have you accidently leaked urine? Y  Do you have problems with loss of bowel control? N  Managing your Medications? N  Managing your Finances? N  Housekeeping or managing your Housekeeping? N  Some recent data might be hidden    Patient Care Team: Carollee Herter, Alferd Apa, DO as PCP - General (Family Medicine) Marylynn Pearson, MD as Consulting Physician (Obstetrics and Gynecology) Lavonna Monarch, MD as Consulting Physician (Dermatology)    Assessment:   This is a routine wellness examination for [name]. Physical assessment deferred to PCP.  Exercise Activities and Dietary recommendations Current Exercise Habits: The patient does not participate in regular exercise at present,  Exercise limited by: None identified Diet (meal preparation, eat out, water intake, caffeinated beverages, dairy products, fruits and vegetables):  Breakfast: ice cream Lunch: bacon and tomato sandwich Dinner: rice, crowder peas, carrot salad.  Goals    . Maintain current health.       Fall Risk Fall Risk  05/29/2018 05/29/2018 09/29/2016 09/27/2016 07/16/2015  Falls in the past year? No No Yes Yes Yes  Comment - - Emmi Telephone Survey: data to providers prior to load - -  Number falls in past yr: - - 2 or more 1 1  Comment - - Emmi Telephone Survey Actual Response = 2 - -  Injury with Fall? - - No Yes No  Comment - - - pt report 60 lb puppy knocked her down no injury but scraped knee. -  Risk for fall due to : - - - - Other (Comment)  Risk for fall due to: Comment - - - - The puppy knocked the patient down    Depression Screen PHQ 2/9 Scores 05/29/2018 05/29/2018 09/27/2016 07/16/2015  PHQ - 2 Score 0 0 0 0     Cognitive Function Ad8 score reviewed for issues:  Issues making decisions:no  Less interest in hobbies / activities:no  Repeats questions, stories (family complaining):no  Trouble using ordinary gadgets (microwave, computer, phone):no  Forgets the month or year: no  Mismanaging finances: no  Remembering appts:no  Daily problems with thinking and/or memory:no Ad8 score is=0  MMSE - Mini Mental State Exam 09/27/2016  Orientation to time 5  Orientation to Place 5  Registration 3  Attention/ Calculation 5  Recall 3  Language- name 2 objects 2  Language- repeat 1  Language- follow 3 step command 3  Language- read & follow direction 1  Write a sentence 1  Copy design 1  Total score 30        Immunization History  Administered Date(s) Administered  . Influenza, High Dose Seasonal PF 08/23/2016  . Influenza-Unspecified 08/24/2014, 07/25/2015  . Pneumococcal Conjugate-13 01/05/2015  . Pneumococcal Polysaccharide-23 09/27/2016  . Tdap 02/06/2015  . Zoster  09/30/2014     Screening Tests Health Maintenance  Topic Date Due  . DEXA SCAN  08/26/2016  . MAMMOGRAM  11/10/2016  . INFLUENZA VACCINE  05/24/2018  . COLONOSCOPY  06/11/2024  . TETANUS/TDAP  02/05/2025  . Hepatitis C Screening  Completed  . PNA vac Low Risk Adult  Completed      Plan:    Please schedule your next medicare wellness visit with me in 1 yr.  Eat heart healthy diet (full of fruits, vegetables, whole grains, lean protein, water--limit salt, fat, and sugar intake) and increase physical activity as tolerated.  Continue doing brain stimulating activities (puzzles, reading, adult coloring books, staying active) to keep memory sharp.   Bring a copy of your living will and/or healthcare power of attorney to your next office visit.    I have personally reviewed and noted the following in the patient's chart:   . Medical and social history . Use of alcohol, tobacco or illicit drugs  . Current medications and supplements . Functional ability and status . Nutritional status . Physical activity . Advanced directives . List of other physicians . Hospitalizations, surgeries, and ER visits in previous 12 months . Vitals . Screenings to include cognitive, depression, and falls . Referrals and appointments  In addition, I have reviewed and discussed with patient certain preventive protocols, quality metrics, and best practice recommendations. A written personalized care plan for preventive services as well as general preventive health recommendations were provided to patient.     Shela Nevin, [place]  05/29/2018

## 2018-05-29 ENCOUNTER — Ambulatory Visit (INDEPENDENT_AMBULATORY_CARE_PROVIDER_SITE_OTHER): Payer: Medicare Other | Admitting: *Deleted

## 2018-05-29 ENCOUNTER — Ambulatory Visit (INDEPENDENT_AMBULATORY_CARE_PROVIDER_SITE_OTHER): Payer: Medicare Other | Admitting: Family Medicine

## 2018-05-29 ENCOUNTER — Encounter: Payer: Self-pay | Admitting: Family Medicine

## 2018-05-29 ENCOUNTER — Encounter: Payer: Self-pay | Admitting: *Deleted

## 2018-05-29 VITALS — BP 132/69 | HR 69 | Ht 63.3 in | Wt 173.0 lb

## 2018-05-29 VITALS — BP 132/69 | HR 69 | Temp 98.4°F | Resp 16 | Ht 63.3 in | Wt 173.0 lb

## 2018-05-29 DIAGNOSIS — R079 Chest pain, unspecified: Secondary | ICD-10-CM | POA: Diagnosis not present

## 2018-05-29 DIAGNOSIS — I10 Essential (primary) hypertension: Secondary | ICD-10-CM | POA: Diagnosis not present

## 2018-05-29 DIAGNOSIS — E785 Hyperlipidemia, unspecified: Secondary | ICD-10-CM | POA: Diagnosis not present

## 2018-05-29 DIAGNOSIS — Z0000 Encounter for general adult medical examination without abnormal findings: Secondary | ICD-10-CM

## 2018-05-29 LAB — COMPREHENSIVE METABOLIC PANEL
ALT: 14 U/L (ref 0–35)
AST: 16 U/L (ref 0–37)
Albumin: 4.2 g/dL (ref 3.5–5.2)
Alkaline Phosphatase: 67 U/L (ref 39–117)
BUN: 11 mg/dL (ref 6–23)
CHLORIDE: 104 meq/L (ref 96–112)
CO2: 31 meq/L (ref 19–32)
Calcium: 9.8 mg/dL (ref 8.4–10.5)
Creatinine, Ser: 0.94 mg/dL (ref 0.40–1.20)
GFR: 62.65 mL/min (ref >60.00)
GLUCOSE: 95 mg/dL (ref 70–99)
POTASSIUM: 4.4 meq/L (ref 3.5–5.1)
SODIUM: 142 meq/L (ref 135–145)
TOTAL PROTEIN: 6.7 g/dL (ref 6.0–8.3)
Total Bilirubin: 1.3 mg/dL — ABNORMAL HIGH (ref 0.2–1.2)

## 2018-05-29 LAB — LIPID PANEL
CHOL/HDL RATIO: 6
Cholesterol: 237 mg/dL — ABNORMAL HIGH (ref 0–200)
HDL: 41 mg/dL (ref >39.00)
LDL CALC: 158 mg/dL — AB (ref 0–99)
NONHDL: 195.57
Triglycerides: 190 mg/dL — ABNORMAL HIGH (ref 0.0–149.0)
VLDL: 38 mg/dL (ref 0.0–40.0)

## 2018-05-29 NOTE — Progress Notes (Signed)
Reviewed  Orpah Hausner R Lowne Chase, DO  

## 2018-05-29 NOTE — Patient Instructions (Signed)
Please schedule your next medicare wellness visit with me in 1 yr.  Eat heart healthy diet (full of fruits, vegetables, whole grains, lean protein, water--limit salt, fat, and sugar intake) and increase physical activity as tolerated.  Continue doing brain stimulating activities (puzzles, reading, adult coloring books, staying active) to keep memory sharp.   Bring a copy of your living will and/or healthcare power of attorney to your next office visit.   Brianna Farrell , Thank you for taking time to come for your Medicare Wellness Visit. I appreciate your ongoing commitment to your health goals. Please review the following plan we discussed and let me know if I can assist you in the future.   These are the goals we discussed: Goals    . Maintain current health.       This is a list of the screening recommended for you and due dates:  Health Maintenance  Topic Date Due  . DEXA scan (bone density measurement)  08/26/2016  . Mammogram  11/10/2016  . Flu Shot  05/24/2018  . Colon Cancer Screening  06/11/2024  . Tetanus Vaccine  02/05/2025  .  Hepatitis C: One time screening is recommended by Center for Disease Control  (CDC) for  adults born from [birth year] through 1967.   Completed  . Pneumonia vaccines  Completed    Health Maintenance for Postmenopausal Women Menopause is a normal process in which your reproductive ability comes to an end. This process happens gradually over a span of months to years, usually between the ages of 85 and 71. Menopause is complete when you have missed 12 consecutive menstrual periods. It is important to talk with your health care provider about some of the most common conditions that affect postmenopausal women, such as heart disease, cancer, and bone loss (osteoporosis). Adopting a healthy lifestyle and getting preventive care can help to promote your health and wellness. Those actions can also lower your chances of developing some of these common conditions. What  should I know about menopause? During menopause, you may experience a number of symptoms, such as:  Moderate-to-severe hot flashes.  Night sweats.  Decrease in sex drive.  Mood swings.  Headaches.  Tiredness.  Irritability.  Memory problems.  Insomnia.  Choosing to treat or not to treat menopausal changes is an individual decision that you make with your health care provider. What should I know about hormone replacement therapy and supplements? Hormone therapy products are effective for treating symptoms that are associated with menopause, such as hot flashes and night sweats. Hormone replacement carries certain risks, especially as you become older. If you are thinking about using estrogen or estrogen with progestin treatments, discuss the benefits and risks with your health care provider. What should I know about heart disease and stroke? Heart disease, heart attack, and stroke become more likely as you age. This may be due, in part, to the hormonal changes that your body experiences during menopause. These can affect how your body processes dietary fats, triglycerides, and cholesterol. Heart attack and stroke are both medical emergencies. There are many things that you can do to help prevent heart disease and stroke:  Have your blood pressure checked at least every 1-2 years. High blood pressure causes heart disease and increases the risk of stroke.  If you are 34-2 years old, ask your health care provider if you should take aspirin to prevent a heart attack or a stroke.  Do not use any tobacco products, including cigarettes, chewing tobacco, or electronic  cigarettes. If you need help quitting, ask your health care provider.  It is important to eat a healthy diet and maintain a healthy weight. ? Be sure to include plenty of vegetables, fruits, low-fat dairy products, and lean protein. ? Avoid eating foods that are high in solid fats, added sugars, or salt (sodium).  Get  regular exercise. This is one of the most important things that you can do for your health. ? Try to exercise for at least 150 minutes each week. The type of exercise that you do should increase your heart rate and make you sweat. This is known as moderate-intensity exercise. ? Try to do strengthening exercises at least twice each week. Do these in addition to the moderate-intensity exercise.  Know your numbers.Ask your health care provider to check your cholesterol and your blood glucose. Continue to have your blood tested as directed by your health care provider.  What should I know about cancer screening? There are several types of cancer. Take the following steps to reduce your risk and to catch any cancer development as early as possible. Breast Cancer  Practice breast self-awareness. ? This means understanding how your breasts normally appear and feel. ? It also means doing regular breast self-exams. Let your health care provider know about any changes, no matter how small.  If you are 10 or older, have a clinician do a breast exam (clinical breast exam or CBE) every year. Depending on your age, family history, and medical history, it may be recommended that you also have a yearly breast X-ray (mammogram).  If you have a family history of breast cancer, talk with your health care provider about genetic screening.  If you are at high risk for breast cancer, talk with your health care provider about having an MRI and a mammogram every year.  Breast cancer (BRCA) gene test is recommended for women who have family members with BRCA-related cancers. Results of the assessment will determine the need for genetic counseling and BRCA1 and for BRCA2 testing. BRCA-related cancers include these types: ? Breast. This occurs in males or females. ? Ovarian. ? Tubal. This may also be called fallopian tube cancer. ? Cancer of the abdominal or pelvic lining (peritoneal  cancer). ? Prostate. ? Pancreatic.  Cervical, Uterine, and Ovarian Cancer Your health care provider may recommend that you be screened regularly for cancer of the pelvic organs. These include your ovaries, uterus, and vagina. This screening involves a pelvic exam, which includes checking for microscopic changes to the surface of your cervix (Pap test).  For women ages 21-65, health care providers may recommend a pelvic exam and a Pap test every three years. For women ages 91-65, they may recommend the Pap test and pelvic exam, combined with testing for human papilloma virus (HPV), every five years. Some types of HPV increase your risk of cervical cancer. Testing for HPV may also be done on women of any age who have unclear Pap test results.  Other health care providers may not recommend any screening for nonpregnant women who are considered low risk for pelvic cancer and have no symptoms. Ask your health care provider if a screening pelvic exam is right for you.  If you have had past treatment for cervical cancer or a condition that could lead to cancer, you need Pap tests and screening for cancer for at least 20 years after your treatment. If Pap tests have been discontinued for you, your risk factors (such as having a new sexual partner)  need to be reassessed to determine if you should start having screenings again. Some women have medical problems that increase the chance of getting cervical cancer. In these cases, your health care provider may recommend that you have screening and Pap tests more often.  If you have a family history of uterine cancer or ovarian cancer, talk with your health care provider about genetic screening.  If you have vaginal bleeding after reaching menopause, tell your health care provider.  There are currently no reliable tests available to screen for ovarian cancer.  Lung Cancer Lung cancer screening is recommended for adults 24-56 years old who are at high risk for  lung cancer because of a history of smoking. A yearly low-dose CT scan of the lungs is recommended if you:  Currently smoke.  Have a history of at least 30 pack-years of smoking and you currently smoke or have quit within the past 15 years. A pack-year is smoking an average of one pack of cigarettes per day for one year.  Yearly screening should:  Continue until it has been 15 years since you quit.  Stop if you develop a health problem that would prevent you from having lung cancer treatment.  Colorectal Cancer  This type of cancer can be detected and can often be prevented.  Routine colorectal cancer screening usually begins at age 49 and continues through age 53.  If you have risk factors for colon cancer, your health care provider may recommend that you be screened at an earlier age.  If you have a family history of colorectal cancer, talk with your health care provider about genetic screening.  Your health care provider may also recommend using home test kits to check for hidden blood in your stool.  A small camera at the end of a tube can be used to examine your colon directly (sigmoidoscopy or colonoscopy). This is done to check for the earliest forms of colorectal cancer.  Direct examination of the colon should be repeated every 5-10 years until age 24. However, if early forms of precancerous polyps or small growths are found or if you have a family history or genetic risk for colorectal cancer, you may need to be screened more often.  Skin Cancer  Check your skin from head to toe regularly.  Monitor any moles. Be sure to tell your health care provider: ? About any new moles or changes in moles, especially if there is a change in a mole's shape or color. ? If you have a mole that is larger than the size of a pencil eraser.  If any of your family members has a history of skin cancer, especially at a young age, talk with your health care provider about genetic  screening.  Always use sunscreen. Apply sunscreen liberally and repeatedly throughout the day.  Whenever you are outside, protect yourself by wearing long sleeves, pants, a wide-brimmed hat, and sunglasses.  What should I know about osteoporosis? Osteoporosis is a condition in which bone destruction happens more quickly than new bone creation. After menopause, you may be at an increased risk for osteoporosis. To help prevent osteoporosis or the bone fractures that can happen because of osteoporosis, the following is recommended:  If you are 47-66 years old, get at least 1,000 mg of calcium and at least 600 mg of vitamin D per day.  If you are older than age 89 but younger than age 73, get at least 1,200 mg of calcium and at least 600 mg of  vitamin D per day.  If you are older than age 60, get at least 1,200 mg of calcium and at least 800 mg of vitamin D per day.  Smoking and excessive alcohol intake increase the risk of osteoporosis. Eat foods that are rich in calcium and vitamin D, and do weight-bearing exercises several times each week as directed by your health care provider. What should I know about how menopause affects my mental health? Depression may occur at any age, but it is more common as you become older. Common symptoms of depression include:  Low or sad mood.  Changes in sleep patterns.  Changes in appetite or eating patterns.  Feeling an overall lack of motivation or enjoyment of activities that you previously enjoyed.  Frequent crying spells.  Talk with your health care provider if you think that you are experiencing depression. What should I know about immunizations? It is important that you get and maintain your immunizations. These include:  Tetanus, diphtheria, and pertussis (Tdap) booster vaccine.  Influenza every year before the flu season begins.  Pneumonia vaccine.  Shingles vaccine.  Your health care provider may also recommend other  immunizations. This information is not intended to replace advice given to you by your health care provider. Make sure you discuss any questions you have with your health care provider. Document Released: 12/02/2005 Document Revised: 04/29/2016 Document Reviewed: 07/14/2015 Elsevier Interactive Patient Education  2018 [employer].

## 2018-05-29 NOTE — Progress Notes (Signed)
Patient ID: Brianna Farrell, female   DOB: 31-Mar-1951, 67 y.o.   MRN: [id number]    Subjective:  I acted as a Education administrator for Dr. Carollee Herter.  Guerry Bruin, [place]   Patient ID: Brianna Farrell, female    DOB: [birth year], 67 y.o.   MRN: [id number]  Chief Complaint  Patient presents with  . Hyperlipidemia  . Hypertension    HPI  Patient is in today for follow up blood pressure and cholesterol.  She does c/o of occassional chest pain---- last episode was 3-4 weeks ago  It lasted several hours and went away.  Pain can radiate to both arms.  No sob. No palp  Patient Care Team: Carollee Herter, Alferd Apa, DO as PCP - General (Family Medicine) Marylynn Pearson, MD as Consulting Physician (Obstetrics and Gynecology) Lavonna Monarch, MD as Consulting Physician (Dermatology)   Past Medical History:  Diagnosis Date  . Anemia   . Arthritis   . Chicken pox   . GERD (gastroesophageal reflux disease)   . History of stomach ulcers   . HTN (hypertension)   . Hyperlipidemia   . IBS (irritable bowel syndrome)   . Macular degeneration   . Migraines   . Pneumonia   . Urine incontinence     Past Surgical History:  Procedure Laterality Date  . ABDOMINAL HYSTERECTOMY     Still has Ovaries  . APPENDECTOMY  1985  . DILATION AND CURETTAGE OF UTERUS  1984  . SHOULDER SURGERY Right    Shoulder Manipulation  . TONSILLECTOMY AND ADENOIDECTOMY  1953    Family History  Problem Relation Age of Onset  . Arthritis Mother   . Osteoarthritis Mother   . Rheum arthritis Mother   . Kidney disease Mother   . Ovarian cancer Cousin        x 2  . Breast cancer Maternal Aunt   . Hyperlipidemia Father   . Heart disease Father   . Hyperlipidemia Brother   . Heart disease Brother   . Stroke Paternal Aunt   . Breast cancer Paternal Aunt     Social History   Socioeconomic History  . Marital status: Widowed    Spouse name: Not on file  . Number of children: 1  . Years of education: Not on file  . Highest education  level: Not on file  Occupational History  . Occupation: retired  Scientific laboratory technician  . Financial resource strain: Not on file  . Food insecurity:    Worry: Not on file    Inability: Not on file  . Transportation needs:    Medical: Not on file    Non-medical: Not on file  Tobacco Use  . Smoking status: Never Smoker  . Smokeless tobacco: Never Used  Substance and Sexual Activity  . Alcohol use: No  . Drug use: No  . Sexual activity: Not on file  Lifestyle  . Physical activity:    Days per week: Not on file    Minutes per session: Not on file  . Stress: Not on file  Relationships  . Social connections:    Talks on phone: Not on file    Gets together: Not on file    Attends religious service: Not on file    Active member of club or organization: Not on file    Attends meetings of clubs or organizations: Not on file    Relationship status: Not on file  . Intimate partner violence:    Fear of current or ex partner: Not on file  Emotionally abused: Not on file    Physically abused: Not on file    Forced sexual activity: Not on file  Other Topics Concern  . Not on file  Social History Narrative  . Not on file    Outpatient Medications Prior to Visit  Medication Sig Dispense Refill  . cetirizine (ZYRTEC) 10 MG tablet Take 5 mg by mouth as needed.     Marland Kitchen co-enzyme Q-10 30 MG capsule Take 30 mg by mouth 3 (three) times daily.    Marland Kitchen doxycycline (VIBRA-TABS) 100 MG tablet Take 1 tablet (100 mg total) by mouth 2 (two) times daily. 20 tablet 0  . hyoscyamine (LEVSIN/SL) 0.125 MG SL tablet Place 1 tablet (0.125 mg total) under the tongue every 4 (four) hours as needed. 30 tablet 0  . loperamide (IMODIUM A-D) 2 MG tablet Take 2 mg by mouth 4 (four) times daily as needed for diarrhea or loose stools.    Marland Kitchen MEGARED OMEGA-3 KRILL OIL PO Take 350 mg by mouth.    . Multiple Vitamins-Minerals (PRESERVISION AREDS 2) CAPS Take by mouth.    . tetrahydrozoline-zinc (VISINE-AC) 0.05-0.25 % ophthalmic  solution 2 drops 3 (three) times daily as needed.     No facility-administered medications prior to visit.     Allergies  Allergen Reactions  . Ibuprofen Anaphylaxis    REACTION: hives, tongue edema  . Nsaids Anaphylaxis    REACTION: hives, tongue edema    Review of Systems  Constitutional: Negative for fever and malaise/fatigue.  HENT: Negative for congestion.   Eyes: Negative for blurred vision.  Respiratory: Negative for cough and shortness of breath.   Cardiovascular: Negative for chest pain, palpitations and leg swelling.  Gastrointestinal: Negative for vomiting.  Musculoskeletal: Negative for back pain.  Skin: Negative for rash.  Neurological: Negative for loss of consciousness and headaches.       Objective:    Physical Exam  Constitutional: She is oriented to person, place, and time. She appears well-developed and well-nourished.  HENT:  Head: Normocephalic and atraumatic.  Eyes: Conjunctivae and EOM are normal.  Neck: Normal range of motion. Neck supple. No JVD present. Carotid bruit is not present. No thyromegaly present.  Cardiovascular: Normal rate, regular rhythm and normal heart sounds.  No murmur heard. Pulmonary/Chest: Effort normal and breath sounds normal. No respiratory distress. She has no wheezes. She has no rales. She exhibits no tenderness.  Musculoskeletal: She exhibits no edema.  Neurological: She is alert and oriented to person, place, and time.  Psychiatric: She has a normal mood and affect.  Nursing note and vitals reviewed.  EKG-- sinus rhythm-- non sp t wave--- no change from 2011---- BP 132/69 (BP Location: Left Arm, Cuff Size: Normal)   Pulse 69   Temp 98.4 F (36.9 C) (Oral)   Resp 16   Ht 5' 3.3" (1.608 m)   Wt 173 lb (78.5 kg)   SpO2 98%   BMI 30.36 kg/m  Wt Readings from Last 3 Encounters:  05/29/18 173 lb (78.5 kg)  05/29/18 173 lb (78.5 kg)  03/14/17 168 lb 12.8 oz (76.6 kg)   BP Readings from Last 3 Encounters:    05/29/18 132/69  05/29/18 132/69  03/14/17 126/78     Immunization History  Administered Date(s) Administered  . Influenza, High Dose Seasonal PF 08/23/2016  . Influenza-Unspecified 08/24/2014, 07/25/2015  . Pneumococcal Conjugate-13 01/05/2015  . Pneumococcal Polysaccharide-23 09/27/2016  . Tdap 02/06/2015  . Zoster 09/30/2014    Health Maintenance  Topic Date  Due  . DEXA SCAN  08/26/2016  . MAMMOGRAM  11/10/2016  . INFLUENZA VACCINE  05/24/2018  . COLONOSCOPY  06/11/2024  . TETANUS/TDAP  02/05/2025  . Hepatitis C Screening  Completed  . PNA vac Low Risk Adult  Completed    Lab Results  Component Value Date   WBC 6.2 03/14/2017   HGB 14.1 03/14/2017   HCT 42.5 03/14/2017   PLT 317.0 03/14/2017   GLUCOSE 95 05/29/2018   CHOL 237 (H) 05/29/2018   TRIG 190.0 (H) 05/29/2018   HDL 41.00 05/29/2018   LDLCALC 158 (H) 05/29/2018   ALT 14 05/29/2018   AST 16 05/29/2018   NA 142 05/29/2018   K 4.4 05/29/2018   CL 104 05/29/2018   CREATININE 0.94 05/29/2018   BUN 11 05/29/2018   CO2 31 05/29/2018   TSH 1.46 04/01/2014    Lab Results  Component Value Date   TSH 1.46 04/01/2014   Lab Results  Component Value Date   WBC 6.2 03/14/2017   HGB 14.1 03/14/2017   HCT 42.5 03/14/2017   MCV 83.6 03/14/2017   PLT 317.0 03/14/2017   Lab Results  Component Value Date   NA 142 05/29/2018   K 4.4 05/29/2018   CO2 31 05/29/2018   GLUCOSE 95 05/29/2018   BUN 11 05/29/2018   CREATININE 0.94 05/29/2018   BILITOT 1.3 (H) 05/29/2018   ALKPHOS 67 05/29/2018   AST 16 05/29/2018   ALT 14 05/29/2018   PROT 6.7 05/29/2018   ALBUMIN 4.2 05/29/2018   CALCIUM 9.8 05/29/2018   GFR 62.65 05/29/2018   Lab Results  Component Value Date   CHOL 237 (H) 05/29/2018   Lab Results  Component Value Date   HDL 41.00 05/29/2018   Lab Results  Component Value Date   LDLCALC 158 (H) 05/29/2018   Lab Results  Component Value Date   TRIG 190.0 (H) 05/29/2018   Lab Results   Component Value Date   CHOLHDL 6 05/29/2018   No results found for: HGBA1C       Assessment & Plan:   Problem List Items Addressed This Visit      Unprioritized   Hyperlipidemia LDL goal <100 - Primary    Encouraged heart healthy diet, increase exercise, avoid trans fats, consider a krill oil cap daily      Relevant Orders   Comprehensive metabolic panel (Completed)   Lipid panel (Completed)   HYPERTENSION, BENIGN    Well controlled, no changes to meds. Encouraged heart healthy diet such as the DASH diet and exercise as tolerated.        Other Visit Diagnoses    Essential hypertension       Relevant Orders   Comprehensive metabolic panel (Completed)   Lipid panel (Completed)   ECHOCARDIOGRAM COMPLETE   Chest pain, unspecified type       Relevant Orders   EKG 12-Lead (Completed)   ECHOCARDIOGRAM COMPLETE   Myocardial Perfusion Imaging    go to ER if cp returns -- pt understands  I have discontinued Jaydalee Pillard's cetirizine, loperamide, tetrahydrozoline-zinc, hyoscyamine, PRESERVISION AREDS 2, co-enzyme Q-10, MEGARED OMEGA-3 KRILL OIL PO, and doxycycline.  No orders of the defined types were placed in this encounter.   CMA served as Education administrator during this visit. History, Physical and Plan performed by medical provider. Documentation and orders reviewed and attested to.  Ann Held, DO

## 2018-05-29 NOTE — Patient Instructions (Signed)
Chest Wall Pain °Chest wall pain is pain in or around the bones and muscles of your chest. Sometimes, an injury causes this pain. Sometimes, the cause may not be known. This pain may take several weeks or longer to get better. °Follow these instructions at home: °Pay attention to any changes in your symptoms. Take these actions to help with your pain: °· Rest as told by your health care provider. °· Avoid activities that cause pain. These include any activities that use your chest muscles or your abdominal and side muscles to lift heavy items. °· If directed, apply ice to the painful area: °? Put ice in a plastic bag. °? Place a towel between your skin and the bag. °? Leave the ice on for 20 minutes, 2-3 times per day. °· Take over-the-counter and prescription medicines only as told by your health care provider. °· Do not use tobacco products, including cigarettes, chewing tobacco, and e-cigarettes. If you need help quitting, ask your health care provider. °· Keep all follow-up visits as told by your health care provider. This is important. ° °Contact a health care provider if: °· You have a fever. °· Your chest pain becomes worse. °· You have new symptoms. °Get help right away if: °· You have nausea or vomiting. °· You feel sweaty or light-headed. °· You have a cough with phlegm (sputum) or you cough up blood. °· You develop shortness of breath. °This information is not intended to replace advice given to you by your health care provider. Make sure you discuss any questions you have with your health care provider. °Document Released: 10/10/2005 Document Revised: 02/18/2016 Document Reviewed: 01/05/2015 °Elsevier Interactive Patient Education © 2018 [employer] Inc[employer] ° °

## 2018-05-31 NOTE — Assessment & Plan Note (Signed)
Well controlled, no changes to meds. Encouraged heart healthy diet such as the DASH diet and exercise as tolerated.  °

## 2018-05-31 NOTE — Assessment & Plan Note (Signed)
Encouraged heart healthy diet, increase exercise, avoid trans fats, consider a krill oil cap daily 

## 2018-06-01 NOTE — Addendum Note (Signed)
Addended byDamita Dunnings D on: 06/01/2018 08:28 AM   Modules accepted: Orders

## 2018-06-04 ENCOUNTER — Telehealth (HOSPITAL_COMMUNITY): Payer: Self-pay | Admitting: *Deleted

## 2018-06-04 NOTE — Telephone Encounter (Signed)
Patient given detailed instructions per Myocardial Perfusion Study Information Sheet for the test on 06/05/18 at 1000. Patient notified to arrive 15 minutes early and that it is imperative to arrive on time for appointment to keep from having the test rescheduled.  If you need to cancel or reschedule your appointment, please call the office within 24 hours of your appointment. . Patient verbalized understanding.Lott Seelbach, [place]

## 2018-06-05 ENCOUNTER — Ambulatory Visit (HOSPITAL_COMMUNITY): Payer: Medicare Other | Attending: Cardiology

## 2018-06-05 DIAGNOSIS — R079 Chest pain, unspecified: Secondary | ICD-10-CM | POA: Diagnosis not present

## 2018-06-05 LAB — MYOCARDIAL PERFUSION IMAGING
CHL CUP NUCLEAR SDS: 5
CHL CUP NUCLEAR SRS: 3
CHL CUP RESTING HR STRESS: 67 {beats}/min
LHR: 0.34
LV dias vol: 50 mL (ref 46–106)
LV sys vol: 14 mL
NUC STRESS TID: 0.93
Peak HR: 98 {beats}/min
SSS: 8

## 2018-06-05 MED ORDER — REGADENOSON 0.4 MG/5ML IV SOLN
0.4000 mg | Freq: Once | INTRAVENOUS | Status: AC
  Administered 2018-06-05: 0.4 mg via INTRAVENOUS

## 2018-06-05 MED ORDER — TECHNETIUM TC 99M TETROFOSMIN IV KIT
32.2000 | PACK | Freq: Once | INTRAVENOUS | Status: AC | PRN
  Administered 2018-06-05: 32.2 via INTRAVENOUS
  Filled 2018-06-05: qty 33

## 2018-06-05 MED ORDER — TECHNETIUM TC 99M TETROFOSMIN IV KIT
10.8000 | PACK | Freq: Once | INTRAVENOUS | Status: AC | PRN
  Administered 2018-06-05: 10.8 via INTRAVENOUS
  Filled 2018-06-05: qty 11

## 2018-06-11 ENCOUNTER — Other Ambulatory Visit: Payer: Self-pay

## 2018-06-11 ENCOUNTER — Ambulatory Visit (HOSPITAL_COMMUNITY): Payer: Medicare Other | Attending: Family Medicine

## 2018-06-11 DIAGNOSIS — E785 Hyperlipidemia, unspecified: Secondary | ICD-10-CM | POA: Insufficient documentation

## 2018-06-11 DIAGNOSIS — I10 Essential (primary) hypertension: Secondary | ICD-10-CM | POA: Diagnosis not present

## 2018-06-11 DIAGNOSIS — I77810 Thoracic aortic ectasia: Secondary | ICD-10-CM | POA: Diagnosis not present

## 2018-06-11 DIAGNOSIS — I119 Hypertensive heart disease without heart failure: Secondary | ICD-10-CM | POA: Diagnosis not present

## 2018-06-11 DIAGNOSIS — R079 Chest pain, unspecified: Secondary | ICD-10-CM | POA: Diagnosis not present

## 2018-06-29 ENCOUNTER — Encounter: Payer: Self-pay | Admitting: Family Medicine

## 2018-08-29 DIAGNOSIS — Z23 Encounter for immunization: Secondary | ICD-10-CM | POA: Diagnosis not present

## 2018-12-03 ENCOUNTER — Ambulatory Visit: Payer: Medicare Other | Admitting: Family Medicine

## 2018-12-04 ENCOUNTER — Ambulatory Visit: Payer: Medicare Other | Admitting: Family Medicine

## 2018-12-13 ENCOUNTER — Ambulatory Visit: Payer: Medicare Other | Admitting: Family Medicine

## 2019-01-28 ENCOUNTER — Ambulatory Visit: Payer: Medicare Other | Admitting: Family Medicine

## 2019-05-29 NOTE — Progress Notes (Signed)
Virtual Visit via Video Note  I connected with patient on 05/31/19 at  2:00 PM EDT by audio enabled telemedicine application and verified that I am speaking with the correct person using two identifiers.   THIS ENCOUNTER IS A VIRTUAL VISIT DUE TO COVID-19 - PATIENT WAS NOT SEEN IN THE OFFICE. PATIENT HAS CONSENTED TO VIRTUAL VISIT / TELEMEDICINE VISIT   Location of patient: home  Location of provider: office  I discussed the limitations of evaluation and management by telemedicine and the availability of in person appointments. The patient expressed understanding and agreed to proceed.   Subjective:   Brianna Farrell is a 68 y.o. female who presents for Medicare Annual (Subsequent) preventive examination.  Review of Systems: No ROS.  Medicare Wellness Virtual Visit.  Visual/audio telehealth visit, UTA vital signs.   See social history for additional risk factors. Cardiac Risk Factors include: advanced age (>85men, >28 women);dyslipidemia;hypertension Home Safety/Smoke Alarms: Feels safe in home. Smoke alarms in place.  Lives alone with dog in 1 story home with basement. Step over tub with grab rail.  Daughter comes to visit everyday.   Female:        Mammo- pt states she canceled due to covid. Will reschedule when she is more comfortable.      Dexa scan-  pt states she canceled due to covid. Will reschedule when she is more comfortable.         CCS- due 2025      Objective:    Advanced Directives 05/31/2019 05/29/2018 09/27/2016  Does Patient Have a Medical Advance Directive? No Yes Yes  Type of Advance Directive - [place];Living will [place];Living will  Does patient want to make changes to medical advance directive? - - No - Patient declined  Copy of [place] in Chart? - No - copy requested No - copy requested  Would patient like information on creating a medical advance directive? No - Patient declined - -    Tobacco  Social History   Tobacco Use  Smoking Status Never Smoker  Smokeless Tobacco Never Used     Counseling given: Not Answered   Clinical Intake:     Pain : No/denies pain                 Past Medical History:  Diagnosis Date  . Anemia   . Arthritis   . Chicken pox   . GERD (gastroesophageal reflux disease)   . History of stomach ulcers   . HTN (hypertension)   . Hyperlipidemia   . IBS (irritable bowel syndrome)   . Macular degeneration   . Migraines   . Pneumonia   . Urine incontinence    Past Surgical History:  Procedure Laterality Date  . ABDOMINAL HYSTERECTOMY     Still has Ovaries  . APPENDECTOMY  1985  . DILATION AND CURETTAGE OF UTERUS  1984  . SHOULDER SURGERY Right    Shoulder Manipulation  . TONSILLECTOMY AND ADENOIDECTOMY  1953   Family History  Problem Relation Age of Onset  . Arthritis Mother   . Osteoarthritis Mother   . Rheum arthritis Mother   . Kidney disease Mother   . Ovarian cancer Cousin        x 2  . Breast cancer Maternal Aunt   . Hyperlipidemia Father   . Heart disease Father   . Hyperlipidemia Brother   . Heart disease Brother   . Stroke Paternal Aunt   . Breast  cancer Paternal Aunt    Social History   Socioeconomic History  . Marital status: Widowed    Spouse name: Not on file  . Number of children: 1  . Years of education: Not on file  . Highest education level: Not on file  Occupational History  . Occupation: retired  Scientific laboratory technician  . Financial resource strain: Not on file  . Food insecurity    Worry: Not on file    Inability: Not on file  . Transportation needs    Medical: Not on file    Non-medical: Not on file  Tobacco Use  . Smoking status: Never Smoker  . Smokeless tobacco: Never Used  Substance and Sexual Activity  . Alcohol use: No  . Drug use: No  . Sexual activity: Not on file  Lifestyle  . Physical activity    Days per week: Not on file    Minutes per session: Not on file  . Stress: Not on  file  Relationships  . Social Herbalist on phone: Not on file    Gets together: Not on file    Attends religious service: Not on file    Active member of club or organization: Not on file    Attends meetings of clubs or organizations: Not on file    Relationship status: Not on file  Other Topics Concern  . Not on file  Social History Narrative  . Not on file    No outpatient encounter medications on file as of 05/31/2019.   No facility-administered encounter medications on file as of 05/31/2019.     Activities of Daily Living In your present state of health, do you have any difficulty performing the following activities: 05/31/2019  Hearing? N  Vision? N  Comment wears glasses.  Difficulty concentrating or making decisions? N  Walking or climbing stairs? N  Dressing or bathing? N  Doing errands, shopping? N  Preparing Food and eating ? N  Using the Toilet? N  In the past six months, have you accidently leaked urine? Y  Do you have problems with loss of bowel control? N  Managing your Medications? N  Managing your Finances? N  Housekeeping or managing your Housekeeping? N  Some recent data might be hidden    Patient Care Team: Carollee Herter, Alferd Apa, DO as PCP - General (Family Medicine) Marylynn Pearson, MD as Consulting Physician (Obstetrics and Gynecology) Lavonna Monarch, MD as Consulting Physician (Dermatology)    Assessment:   This is a routine wellness examination for Brianna Farrell.Physical assessment deferred to PCP.  Exercise Activities and Dietary recommendations Current Exercise Habits: Home exercise routine, Time (Minutes): 20, Frequency (Times/Week): 4, Weekly Exercise (Minutes/Week): 80, Intensity: Mild, Exercise limited by: None identified Diet (meal preparation, eat out, water intake, caffeinated beverages, dairy products, fruits and vegetables): 24 hr recall Breakfast:fruit Lunch: bologna sandwich Dinner:   Cheese and crackers, fruit Drinks 6-8 glasses  of water per day  Goals    . Maintain current health.       Fall Risk Fall Risk  05/31/2019 05/29/2018 05/29/2018 05/25/2018 09/29/2016  Falls in the past year? 0 No No No Yes  Comment - - - Emmi Telephone Survey: data to providers prior to load Emmi Telephone Survey: data to providers prior to load  Number falls in past yr: - - - - 2 or more  Comment - - - - Emmi Telephone Survey Actual Response = 2  Injury with Fall? - - - - No  Comment - - - - -  Risk for fall due to : - - - - -  Risk for fall due to: Comment - - - - -   Depression Screen PHQ 2/9 Scores 05/31/2019 05/29/2018 05/29/2018 09/27/2016  PHQ - 2 Score 0 0 0 0     Cognitive Function Ad8 score reviewed for issues:  Issues making decisions:no  Less interest in hobbies / activities:no  Repeats questions, stories (family complaining):no  Trouble using ordinary gadgets (microwave, computer, phone):no  Forgets the month or year: no  Mismanaging finances: no  Remembering appts:no Daily problems with thinking and/or memory:no Ad8 score is=0     MMSE - Mini Mental State Exam 09/27/2016  Orientation to time 5  Orientation to Place 5  Registration 3  Attention/ Calculation 5  Recall 3  Language- name 2 objects 2  Language- repeat 1  Language- follow 3 step command 3  Language- read & follow direction 1  Write a sentence 1  Copy design 1  Total score 30        Immunization History  Administered Date(s) Administered  . Influenza, High Dose Seasonal PF 08/23/2016  . Influenza-Unspecified 08/24/2014, 07/25/2015  . Pneumococcal Conjugate-13 01/05/2015  . Pneumococcal Polysaccharide-23 09/27/2016  . Tdap 02/06/2015  . Zoster 09/30/2014   Screening Tests Health Maintenance  Topic Date Due  . DEXA SCAN  01/26/2019  . MAMMOGRAM  02/23/2019  . INFLUENZA VACCINE  05/25/2019  . COLONOSCOPY  06/11/2024  . TETANUS/TDAP  02/05/2025  . Hepatitis C Screening  Completed  . PNA vac Low Risk Adult  Completed     Plan:    See you next year!  Continue to eat heart healthy diet (full of fruits, vegetables, whole grains, lean protein, water--limit salt, fat, and sugar intake) and increase physical activity as tolerated.  Continue doing brain stimulating activities (puzzles, reading, adult coloring books, staying active) to keep memory sharp.     I have personally reviewed and noted the following in the patient's chart:   . Medical and social history . Use of alcohol, tobacco or illicit drugs  . Current medications and supplements . Functional ability and status . Nutritional status . Physical activity . Advanced directives . List of other physicians . Hospitalizations, surgeries, and ER visits in previous 12 months . Vitals . Screenings to include cognitive, depression, and falls . Referrals and appointments  In addition, I have reviewed and discussed with patient certain preventive protocols, quality metrics, and best practice recommendations. A written personalized care plan for preventive services as well as general preventive health recommendations were provided to patient.     Shela Nevin, [place]  05/31/2019

## 2019-05-31 ENCOUNTER — Ambulatory Visit (INDEPENDENT_AMBULATORY_CARE_PROVIDER_SITE_OTHER): Payer: Medicare Other | Admitting: *Deleted

## 2019-05-31 ENCOUNTER — Other Ambulatory Visit: Payer: Self-pay

## 2019-05-31 ENCOUNTER — Encounter: Payer: Self-pay | Admitting: *Deleted

## 2019-05-31 DIAGNOSIS — Z0000 Encounter for general adult medical examination without abnormal findings: Secondary | ICD-10-CM | POA: Diagnosis not present

## 2019-05-31 NOTE — Patient Instructions (Signed)
See you next year!  Continue to eat heart healthy diet (full of fruits, vegetables, whole grains, lean protein, water--limit salt, fat, and sugar intake) and increase physical activity as tolerated.  Continue doing brain stimulating activities (puzzles, reading, adult coloring books, staying active) to keep memory sharp.    Brianna Farrell , Thank you for taking time to come for your Medicare Wellness Visit. I appreciate your ongoing commitment to your health goals. Please review the following plan we discussed and let me know if I can assist you in the future.   These are the goals we discussed: Goals    . Maintain current health.       This is a list of the screening recommended for you and due dates:  Health Maintenance  Topic Date Due  . DEXA scan (bone density measurement)  01/26/2019  . Mammogram  02/23/2019  . Flu Shot  05/25/2019  . Colon Cancer Screening  06/11/2024  . Tetanus Vaccine  02/05/2025  .  Hepatitis C: One time screening is recommended by Center for Disease Control  (CDC) for  adults born from [birth year] through 1967.   Completed  . Pneumonia vaccines  Completed    Health Maintenance After Age 59 After age 66, you are at a higher risk for certain long-term diseases and infections as well as injuries from falls. Falls are a major cause of broken bones and head injuries in people who are older than age 90. Getting regular preventive care can help to keep you healthy and well. Preventive care includes getting regular testing and making lifestyle changes as recommended by your health care provider. Talk with your health care provider about:  Which screenings and tests you should have. A screening is a test that checks for a disease when you have no symptoms.  A diet and exercise plan that is right for you. What should I know about screenings and tests to prevent falls? Screening and testing are the best ways to find a health problem early. Early diagnosis and treatment give  you the best chance of managing medical conditions that are common after age 72. Certain conditions and lifestyle choices may make you more likely to have a fall. Your health care provider may recommend:  Regular vision checks. Poor vision and conditions such as cataracts can make you more likely to have a fall. If you wear glasses, make sure to get your prescription updated if your vision changes.  Medicine review. Work with your health care provider to regularly review all of the medicines you are taking, including over-the-counter medicines. Ask your health care provider about any side effects that may make you more likely to have a fall. Tell your health care provider if any medicines that you take make you feel dizzy or sleepy.  Osteoporosis screening. Osteoporosis is a condition that causes the bones to get weaker. This can make the bones weak and cause them to break more easily.  Blood pressure screening. Blood pressure changes and medicines to control blood pressure can make you feel dizzy.  Strength and balance checks. Your health care provider may recommend certain tests to check your strength and balance while standing, walking, or changing positions.  Foot health exam. Foot pain and numbness, as well as not wearing proper footwear, can make you more likely to have a fall.  Depression screening. You may be more likely to have a fall if you have a fear of falling, feel emotionally low, or feel unable to do activities  that you used to do.  Alcohol use screening. Using too much alcohol can affect your balance and may make you more likely to have a fall. What actions can I take to lower my risk of falls? General instructions  Talk with your health care provider about your risks for falling. Tell your health care provider if: ? You fall. Be sure to tell your health care provider about all falls, even ones that seem minor. ? You feel dizzy, sleepy, or off-balance.  Take over-the-counter  and prescription medicines only as told by your health care provider. These include any supplements.  Eat a healthy diet and maintain a healthy weight. A healthy diet includes low-fat dairy products, low-fat (lean) meats, and fiber from whole grains, beans, and lots of fruits and vegetables. Home safety  Remove any tripping hazards, such as rugs, cords, and clutter.  Install safety equipment such as grab bars in bathrooms and safety rails on stairs.  Keep rooms and walkways well-lit. Activity   Follow a regular exercise program to stay fit. This will help you maintain your balance. Ask your health care provider what types of exercise are appropriate for you.  If you need a cane or walker, use it as recommended by your health care provider.  Wear supportive shoes that have nonskid soles. Lifestyle  Do not drink alcohol if your health care provider tells you not to drink.  If you drink alcohol, limit how much you have: ? 0-1 drink a day for women. ? 0-2 drinks a day for men.  Be aware of how much alcohol is in your drink. In the [place] one drink equals one typical bottle of beer (12 oz), one-half glass of wine (5 oz), or one shot of hard liquor (1 oz).  Do not use any products that contain nicotine or tobacco, such as cigarettes and e-cigarettes. If you need help quitting, ask your health care provider. Summary  Having a healthy lifestyle and getting preventive care can help to protect your health and wellness after age 60.  Screening and testing are the best way to find a health problem early and help you avoid having a fall. Early diagnosis and treatment give you the best chance for managing medical conditions that are more common for people who are older than age 15.  Falls are a major cause of broken bones and head injuries in people who are older than age 60. Take precautions to prevent a fall at home.  Work with your health care provider to learn what changes you can make to  improve your health and wellness and to prevent falls. This information is not intended to replace advice given to you by your health care provider. Make sure you discuss any questions you have with your health care provider. Document Released: 08/23/2017 Document Revised: 01/31/2019 Document Reviewed: 08/23/2017 Elsevier Patient Education  2020 [employer].

## 2019-07-17 DIAGNOSIS — H52209 Unspecified astigmatism, unspecified eye: Secondary | ICD-10-CM | POA: Diagnosis not present

## 2019-07-17 DIAGNOSIS — H353131 Nonexudative age-related macular degeneration, bilateral, early dry stage: Secondary | ICD-10-CM | POA: Diagnosis not present

## 2019-07-17 DIAGNOSIS — H40059 Ocular hypertension, unspecified eye: Secondary | ICD-10-CM | POA: Diagnosis not present

## 2019-07-17 DIAGNOSIS — H2513 Age-related nuclear cataract, bilateral: Secondary | ICD-10-CM | POA: Diagnosis not present

## 2019-09-10 DIAGNOSIS — Z01419 Encounter for gynecological examination (general) (routine) without abnormal findings: Secondary | ICD-10-CM | POA: Diagnosis not present

## 2019-09-10 DIAGNOSIS — Z6830 Body mass index (BMI) 30.0-30.9, adult: Secondary | ICD-10-CM | POA: Diagnosis not present

## 2019-09-10 DIAGNOSIS — N958 Other specified menopausal and perimenopausal disorders: Secondary | ICD-10-CM | POA: Diagnosis not present

## 2019-09-10 DIAGNOSIS — Z1231 Encounter for screening mammogram for malignant neoplasm of breast: Secondary | ICD-10-CM | POA: Diagnosis not present

## 2019-09-24 DIAGNOSIS — H40053 Ocular hypertension, bilateral: Secondary | ICD-10-CM | POA: Diagnosis not present

## 2020-07-21 DIAGNOSIS — H353131 Nonexudative age-related macular degeneration, bilateral, early dry stage: Secondary | ICD-10-CM | POA: Diagnosis not present

## 2020-07-21 DIAGNOSIS — H40059 Ocular hypertension, unspecified eye: Secondary | ICD-10-CM | POA: Diagnosis not present

## 2020-07-21 DIAGNOSIS — H2513 Age-related nuclear cataract, bilateral: Secondary | ICD-10-CM | POA: Diagnosis not present

## 2020-07-21 DIAGNOSIS — H52209 Unspecified astigmatism, unspecified eye: Secondary | ICD-10-CM | POA: Diagnosis not present

## 2020-10-28 DIAGNOSIS — H2513 Age-related nuclear cataract, bilateral: Secondary | ICD-10-CM | POA: Diagnosis not present

## 2020-10-28 DIAGNOSIS — H40053 Ocular hypertension, bilateral: Secondary | ICD-10-CM | POA: Diagnosis not present

## 2020-10-28 DIAGNOSIS — H353131 Nonexudative age-related macular degeneration, bilateral, early dry stage: Secondary | ICD-10-CM | POA: Diagnosis not present

## 2020-10-28 DIAGNOSIS — H11003 Unspecified pterygium of eye, bilateral: Secondary | ICD-10-CM | POA: Diagnosis not present

## 2020-11-24 DIAGNOSIS — H40013 Open angle with borderline findings, low risk, bilateral: Secondary | ICD-10-CM | POA: Diagnosis not present

## 2020-11-24 DIAGNOSIS — H11002 Unspecified pterygium of left eye: Secondary | ICD-10-CM | POA: Diagnosis not present

## 2021-02-10 DIAGNOSIS — H11002 Unspecified pterygium of left eye: Secondary | ICD-10-CM | POA: Diagnosis not present

## 2021-04-14 DIAGNOSIS — H2513 Age-related nuclear cataract, bilateral: Secondary | ICD-10-CM | POA: Diagnosis not present

## 2021-04-14 DIAGNOSIS — H2512 Age-related nuclear cataract, left eye: Secondary | ICD-10-CM | POA: Diagnosis not present

## 2021-04-15 ENCOUNTER — Ambulatory Visit (INDEPENDENT_AMBULATORY_CARE_PROVIDER_SITE_OTHER): Payer: Medicare Other

## 2021-04-15 VITALS — Ht 63.0 in | Wt 173.0 lb

## 2021-04-15 DIAGNOSIS — Z0000 Encounter for general adult medical examination without abnormal findings: Secondary | ICD-10-CM

## 2021-04-15 NOTE — Progress Notes (Signed)
Subjective:   Brianna Farrell is a 70 y.o. female who presents for Medicare Annual (Subsequent) preventive examination.  I connected with Brianna Farrell today by telephone and verified that I am speaking with the correct person using two identifiers. Location patient: home Location provider: work Persons participating in the virtual visit: patient, Marine scientist.    I discussed the limitations, risks, security and privacy concerns of performing an evaluation and management service by telephone and the availability of in person appointments. I also discussed with the patient that there may be a patient responsible charge related to this service. The patient expressed understanding and verbally consented to this telephonic visit.    Interactive audio and video telecommunications were attempted between this provider and patient, however failed, due to patient having technical difficulties OR patient did not have access to video capability.  We continued and completed visit with audio only.  Some vital signs may be absent or patient reported.   Time Spent with patient on telephone encounter: 20 minutes   Review of Systems     Cardiac Risk Factors include: advanced age (>64men, >22 women);hypertension;dyslipidemia;obesity (BMI >30kg/m2)     Objective:    Today's Vitals   04/15/21 1545  Weight: 173 lb (78.5 kg)  Height: 5\' 3"  (1.6 m)   Body mass index is 30.65 kg/m.  Advanced Directives 04/15/2021 05/31/2019 05/29/2018 09/27/2016  Does Patient Have a Medical Advance Directive? Yes No Yes Yes  Type of Paramedic of [place];Living will - Agency;Living will [place];Living will  Does patient want to make changes to medical advance directive? - - - No - Patient declined  Copy of [place] in Chart? No - copy requested - No - copy requested No - copy requested  Would patient like information on creating a medical advance  directive? - No - Patient declined - -    Current Medications (verified) No outpatient encounter medications on file as of 04/15/2021.   No facility-administered encounter medications on file as of 04/15/2021.    Allergies (verified) Ibuprofen and Nsaids   History: Past Medical History:  Diagnosis Date   Anemia    Arthritis    Chicken pox    GERD (gastroesophageal reflux disease)    History of stomach ulcers    HTN (hypertension)    Hyperlipidemia    IBS (irritable bowel syndrome)    Macular degeneration    Migraines    Pneumonia    Urine incontinence    Past Surgical History:  Procedure Laterality Date   ABDOMINAL HYSTERECTOMY     Still has Ovaries   APPENDECTOMY  10/25/1983   DILATION AND CURETTAGE OF UTERUS  10/24/1982   EYE SURGERY     SHOULDER SURGERY Right    Shoulder Manipulation   TONSILLECTOMY AND ADENOIDECTOMY  10/25/1951   Family History  Problem Relation Age of Onset   Arthritis Mother    Osteoarthritis Mother    Rheum arthritis Mother    Kidney disease Mother    Ovarian cancer Cousin        x 2   Breast cancer Maternal Aunt    Hyperlipidemia Father    Heart disease Father    Hyperlipidemia Brother    Heart disease Brother    Stroke Paternal Aunt    Breast cancer Paternal Aunt    Social History   Socioeconomic History   Marital status: Widowed    Spouse name: Not on file   Number of  children: 1   Years of education: Not on file   Highest education level: Not on file  Occupational History   Occupation: retired  Tobacco Use   Smoking status: Never   Smokeless tobacco: Never  Substance and Sexual Activity   Alcohol use: No   Drug use: No   Sexual activity: Not on file  Other Topics Concern   Not on file  Social History Narrative   Not on file   Social Determinants of Health   Financial Resource Strain: Low Risk    Difficulty of Paying Living Expenses: Not very hard  Food Insecurity: No Food Insecurity   Worried About Running Out  of Food in the Last Year: Never true   [place] in the Last Year: Never true  Transportation Needs: No Transportation Needs   Lack of Transportation (Medical): No   Lack of Transportation (Non-Medical): No  Physical Activity: Sufficiently Active   Days of Exercise per Week: 6 days   Minutes of Exercise per Session: 30 min  Stress: No Stress Concern Present   Feeling of Stress : Not at all  Social Connections: Moderately Isolated   Frequency of Communication with Friends and Family: More than three times a week   Frequency of Social Gatherings with Friends and Family: More than three times a week   Attends Religious Services: More than 4 times per year   Active Member of [employer] or Organizations: No   Attends Archivist Meetings: Never   Marital Status: Widowed    Tobacco Counseling Counseling given: Not Answered   Clinical Intake:  Pre-visit preparation completed: Yes  Pain : No/denies pain     Nutritional Status: BMI > 30  Obese Nutritional Risks: None Diabetes: No  How often do you need to have someone help you when you read instructions, pamphlets, or other written materials from your doctor or pharmacy?: 1 - Never  Diabetic?No  Interpreter Needed?: No  Information entered by :: Caroleen Hamman LPN   Activities of Daily Living In your present state of health, do you have any difficulty performing the following activities: 04/15/2021  Hearing? N  Vision? N  Difficulty concentrating or making decisions? N  Walking or climbing stairs? N  Dressing or bathing? N  Doing errands, shopping? N  Preparing Food and eating ? N  Using the Toilet? N  In the past six months, have you accidently leaked urine? Y  Do you have problems with loss of bowel control? N  Managing your Medications? N  Managing your Finances? N  Housekeeping or managing your Housekeeping? N  Some recent data might be hidden    Patient Care Team: Carollee Herter, Alferd Apa, DO as PCP -  General (Family Medicine) Marylynn Pearson, MD as Consulting Physician (Obstetrics and Gynecology) Lavonna Monarch, MD as Consulting Physician (Dermatology)  Indicate any recent Medical Services you may have received from other than [facility] providers in the past year (date may be approximate).     Assessment:   This is a routine wellness examination for [name].  Hearing/Vision screen Hearing Screening - Comments:: C/o mild hearing loss Vision Screening - Comments:: Last eye exam-2022-Dr. Fredricks Wears glasses  Dietary issues and exercise activities discussed: Current Exercise Habits: Home exercise routine, Type of exercise: walking, Time (Minutes): 30, Frequency (Times/Week): 6, Weekly Exercise (Minutes/Week): 180, Intensity: Mild, Exercise limited by: None identified   Goals Addressed             This Visit's Progress  Maintain current health.   On track      Depression Screen PHQ 2/9 Scores 04/15/2021 05/31/2019 05/29/2018 05/29/2018 09/27/2016 07/16/2015 04/01/2014  PHQ - 2 Score 0 0 0 0 0 0 0    Fall Risk Fall Risk  04/15/2021 05/31/2019 05/29/2018 05/29/2018 05/25/2018  Falls in the past year? 0 0 No No No  Comment - - - - Emmi Telephone Survey: data to providers prior to load  Number falls in past yr: 0 - - - -  Comment - - - - -  Injury with Fall? 0 - - - -  Comment - - - - -  Risk for fall due to : - - - - -  Risk for fall due to: Comment - - - - -  Follow up Falls prevention discussed - - - -    FALL RISK PREVENTION PERTAINING TO THE HOME:  Any stairs in or around the home? Yes  If so, are there any without handrails? No  Home free of loose throw rugs in walkways, pet beds, electrical cords, etc? Yes  Adequate lighting in your home to reduce risk of falls? Yes   ASSISTIVE DEVICES UTILIZED TO PREVENT FALLS:  Life alert? No  Use of a cane, walker or w/c? No  Grab bars in the bathroom? Yes  Shower chair or bench in shower? No  Elevated toilet seat or a handicapped toilet?  No   TIMED UP AND GO:  Was the test performed? No . Phone visit   Cognitive Function:Normal cognitive status assessed by this Nurse Health Advisor. No abnormalities found.   MMSE - Mini Mental State Exam 09/27/2016  Orientation to time 5  Orientation to Place 5  Registration 3  Attention/ Calculation 5  Recall 3  Language- name 2 objects 2  Language- repeat 1  Language- follow 3 step command 3  Language- read & follow direction 1  Write a sentence 1  Copy design 1  Total score 30        Immunizations Immunization History  Administered Date(s) Administered   Influenza, High Dose Seasonal PF 08/23/2016, 08/30/2017   Influenza-Unspecified 08/24/2014, 07/25/2015, 08/24/2018   Pneumococcal Conjugate-13 01/05/2015   Pneumococcal Polysaccharide-23 09/27/2016   Tdap 02/06/2015   Zoster Recombinat (Shingrix) 11/05/2018, 01/15/2019   Zoster, Live 09/30/2014    TDAP status: Up to date  Flu vaccine status : Due 06/2021  Pneumococcal vaccine status: Up to date  Covid-19 vaccine status: Declined, Education has been provided regarding the importance of this vaccine but patient still declined. Advised may receive this vaccine at local pharmacy or Health Dept.or vaccine clinic. Aware to provide a copy of the vaccination record if obtained from local pharmacy or Health Dept. Verbalized acceptance and understanding.  Qualifies for Shingles Vaccine? No   Zostavax completed Yes   Shingrix Completed?: Yes  Screening Tests Health Maintenance  Topic Date Due   DEXA SCAN  01/26/2019   MAMMOGRAM  02/23/2019   COVID-19 Vaccine (1) 05/01/2021 (Originally 11/04/1955)   INFLUENZA VACCINE  05/24/2021   COLONOSCOPY (Pts 45-50yrs Insurance coverage will need to be confirmed)  06/11/2024   TETANUS/TDAP  02/05/2025   Hepatitis C Screening  Completed   PNA vac Low Risk Adult  Completed   Zoster Vaccines- Shingrix  Completed   HPV VACCINES  Aged Out    Health Maintenance  Health  Maintenance Due  Topic Date Due   DEXA SCAN  01/26/2019   MAMMOGRAM  02/23/2019    Colorectal cancer screening:  Type of screening: Colonoscopy. Completed 06/11/2014. Repeat every 10 years  Mammogram status: Due-Patient states she will have GYN order. Requested patient have copy of results sent to PCP.  Bone Density status:Due-Patient states she will have GYN order. Requested patient have copy of results sent to PCP.    Lung Cancer Screening: (Low Dose CT Chest recommended if Age 3-80 years, 30 pack-year currently smoking OR have quit w/in 15years.) does not qualify.     Additional Screening:  Hepatitis C Screening:  Completed 07/16/2015  Vision Screening: Recommended annual ophthalmology exams for early detection of glaucoma and other disorders of the eye. Is the patient up to date with their annual eye exam?  Yes  Who is the provider or what is the name of the office in which the patient attends annual eye exams? Dr. Girard Cooter   Dental Screening: Recommended annual dental exams for proper oral hygiene  Community Resource Referral / Chronic Care Management: CRR required this visit?  No   CCM required this visit?  No      Plan:     I have personally reviewed and noted the following in the patient's chart:   Medical and social history Use of alcohol, tobacco or illicit drugs  Current medications and supplements including opioid prescriptions.  Functional ability and status Nutritional status Physical activity Advanced directives List of other physicians Hospitalizations, surgeries, and ER visits in previous 12 months Vitals Screenings to include cognitive, depression, and falls Referrals and appointments  In addition, I have reviewed and discussed with patient certain preventive protocols, quality metrics, and best practice recommendations. A written personalized care plan for preventive services as well as general preventive health recommendations were provided to  patient.   Due to this being a telephonic visit, the after visit summary with patients personalized plan was offered to patient via mail or my-chart. Per request, patient was mailed a copy of AVS.   Marta Antu, LPN   04/20/3150  Nurse Health Advisor  Nurse Notes: None

## 2021-04-15 NOTE — Patient Instructions (Signed)
Brianna Farrell , Thank you for taking time to complete your Medicare Wellness Visit. I appreciate your ongoing commitment to your health goals. Please review the following plan we discussed and let me know if I can assist you in the future.   Screening recommendations/referrals: Colonoscopy: Completed 06/11/2014-Due 06/11/2024 Mammogram: Per our conversation, you will have done at GYN office.Please have copy of report sent to PCP. Bone Density: Per our conversation, you will have done at GYN office.Please have copy of report sent to PCP. Recommended yearly ophthalmology/optometry visit for glaucoma screening and checkup Recommended yearly dental visit for hygiene and checkup  Vaccinations: Influenza vaccine: Due 06/2021 Pneumococcal vaccine: Up to date Tdap vaccine: Up to date-Due-02/05/2025 Shingles vaccine: Completed vaccines   Covid-19:Declined  Advanced directives: Please bring a copy for your chart  Conditions/risks identified: See problem list  Next appointment: Follow up in one year for your annual wellness visit    Preventive Care 72 Years and Older, Female Preventive care refers to lifestyle choices and visits with your health care provider that can promote health and wellness. What does preventive care include? A yearly physical exam. This is also called an annual well check. Dental exams once or twice a year. Routine eye exams. Ask your health care provider how often you should have your eyes checked. Personal lifestyle choices, including: Daily care of your teeth and gums. Regular physical activity. Eating a healthy diet. Avoiding tobacco and drug use. Limiting alcohol use. Practicing safe sex. Taking low-dose aspirin every day. Taking vitamin and mineral supplements as recommended by your health care provider. What happens during an annual well check? The services and screenings done by your health care provider during your annual well check will depend on your age,  overall health, lifestyle risk factors, and family history of disease. Counseling  Your health care provider may ask you questions about your: Alcohol use. Tobacco use. Drug use. Emotional well-being. Home and relationship well-being. Sexual activity. Eating habits. History of falls. Memory and ability to understand (cognition). Work and work Statistician. Reproductive health. Screening  You may have the following tests or measurements: Height, weight, and BMI. Blood pressure. Lipid and cholesterol levels. These may be checked every 5 years, or more frequently if you are over 46 years old. Skin check. Lung cancer screening. You may have this screening every year starting at age 72 if you have a 30-pack-year history of smoking and currently smoke or have quit within the past 15 years. Fecal occult blood test (FOBT) of the stool. You may have this test every year starting at age 72. Flexible sigmoidoscopy or colonoscopy. You may have a sigmoidoscopy every 5 years or a colonoscopy every 10 years starting at age 72. Hepatitis C blood test. Hepatitis B blood test. Sexually transmitted disease (STD) testing. Diabetes screening. This is done by checking your blood sugar (glucose) after you have not eaten for a while (fasting). You may have this done every 1-3 years. Bone density scan. This is done to screen for osteoporosis. You may have this done starting at age 72. Mammogram. This may be done every 1-2 years. Talk to your health care provider about how often you should have regular mammograms. Talk with your health care provider about your test results, treatment options, and if necessary, the need for more tests. Vaccines  Your health care provider may recommend certain vaccines, such as: Influenza vaccine. This is recommended every year. Tetanus, diphtheria, and acellular pertussis (Tdap, Td) vaccine. You may need a Td booster  every 10 years. Zoster vaccine. You may need this after age  72. Pneumococcal 13-valent conjugate (PCV13) vaccine. One dose is recommended after age 72. Pneumococcal polysaccharide (PPSV23) vaccine. One dose is recommended after age 72. Talk to your health care provider about which screenings and vaccines you need and how often you need them. This information is not intended to replace advice given to you by your health care provider. Make sure you discuss any questions you have with your health care provider. Document Released: 11/06/2015 Document Revised: 06/29/2016 Document Reviewed: 08/11/2015 Elsevier Interactive Patient Education  2017 [place] Prevention in the Home Falls can cause injuries. They can happen to people of all ages. There are many things you can do to make your home safe and to help prevent falls. What can I do on the outside of my home? Regularly fix the edges of walkways and driveways and fix any cracks. Remove anything that might make you trip as you walk through a door, such as a raised step or threshold. Trim any bushes or trees on the path to your home. Use bright outdoor lighting. Clear any walking paths of anything that might make someone trip, such as rocks or tools. Regularly check to see if handrails are loose or broken. Make sure that both sides of any steps have handrails. Any raised decks and porches should have guardrails on the edges. Have any leaves, snow, or ice cleared regularly. Use sand or salt on walking paths during winter. Clean up any spills in your garage right away. This includes oil or grease spills. What can I do in the bathroom? Use night lights. Install grab bars by the toilet and in the tub and shower. Do not use towel bars as grab bars. Use non-skid mats or decals in the tub or shower. If you need to sit down in the shower, use a plastic, non-slip stool. Keep the floor dry. Clean up any water that spills on the floor as soon as it happens. Remove soap buildup in the tub or shower  regularly. Attach bath mats securely with double-sided non-slip rug tape. Do not have throw rugs and other things on the floor that can make you trip. What can I do in the bedroom? Use night lights. Make sure that you have a light by your bed that is easy to reach. Do not use any sheets or blankets that are too big for your bed. They should not hang down onto the floor. Have a firm chair that has side arms. You can use this for support while you get dressed. Do not have throw rugs and other things on the floor that can make you trip. What can I do in the kitchen? Clean up any spills right away. Avoid walking on wet floors. Keep items that you use a lot in easy-to-reach places. If you need to reach something above you, use a strong step stool that has a grab bar. Keep electrical cords out of the way. Do not use floor polish or wax that makes floors slippery. If you must use wax, use non-skid floor wax. Do not have throw rugs and other things on the floor that can make you trip. What can I do with my stairs? Do not leave any items on the stairs. Make sure that there are handrails on both sides of the stairs and use them. Fix handrails that are broken or loose. Make sure that handrails are as long as the stairways. Check any carpeting to  make sure that it is firmly attached to the stairs. Fix any carpet that is loose or worn. Avoid having throw rugs at the top or bottom of the stairs. If you do have throw rugs, attach them to the floor with carpet tape. Make sure that you have a light switch at the top of the stairs and the bottom of the stairs. If you do not have them, ask someone to add them for you. What else can I do to help prevent falls? Wear shoes that: Do not have high heels. Have rubber bottoms. Are comfortable and fit you well. Are closed at the toe. Do not wear sandals. If you use a stepladder: Make sure that it is fully opened. Do not climb a closed stepladder. Make sure that  both sides of the stepladder are locked into place. Ask someone to hold it for you, if possible. Clearly mark and make sure that you can see: Any grab bars or handrails. First and last steps. Where the edge of each step is. Use tools that help you move around (mobility aids) if they are needed. These include: Canes. Walkers. Scooters. Crutches. Turn on the lights when you go into a dark area. Replace any light bulbs as soon as they burn out. Set up your furniture so you have a clear path. Avoid moving your furniture around. If any of your floors are uneven, fix them. If there are any pets around you, be aware of where they are. Review your medicines with your doctor. Some medicines can make you feel dizzy. This can increase your chance of falling. Ask your doctor what other things that you can do to help prevent falls. This information is not intended to replace advice given to you by your health care provider. Make sure you discuss any questions you have with your health care provider. Document Released: 08/06/2009 Document Revised: 03/17/2016 Document Reviewed: 11/14/2014 Elsevier Interactive Patient Education  2017 [employer].

## 2021-05-31 ENCOUNTER — Other Ambulatory Visit: Payer: Self-pay

## 2021-05-31 ENCOUNTER — Encounter: Payer: Self-pay | Admitting: Family Medicine

## 2021-05-31 ENCOUNTER — Ambulatory Visit (INDEPENDENT_AMBULATORY_CARE_PROVIDER_SITE_OTHER): Payer: Medicare Other | Admitting: Family Medicine

## 2021-05-31 VITALS — BP 142/75 | HR 72 | Temp 98.4°F | Resp 16 | Ht 63.0 in | Wt 175.0 lb

## 2021-05-31 DIAGNOSIS — K219 Gastro-esophageal reflux disease without esophagitis: Secondary | ICD-10-CM

## 2021-05-31 DIAGNOSIS — R030 Elevated blood-pressure reading, without diagnosis of hypertension: Secondary | ICD-10-CM

## 2021-05-31 DIAGNOSIS — R0683 Snoring: Secondary | ICD-10-CM

## 2021-05-31 DIAGNOSIS — R600 Localized edema: Secondary | ICD-10-CM | POA: Diagnosis not present

## 2021-05-31 DIAGNOSIS — E785 Hyperlipidemia, unspecified: Secondary | ICD-10-CM | POA: Diagnosis not present

## 2021-05-31 MED ORDER — FAMOTIDINE 20 MG PO TABS: 20.0000 mg | ORAL_TABLET | Freq: Two times a day (BID) | ORAL | 5 refills | Status: DC

## 2021-05-31 NOTE — Patient Instructions (Signed)
https://www.nhlbi.nih.gov/files/docs/public/heart/dash_brief.pdf">  DASH Eating Plan DASH stands for Dietary Approaches to Stop Hypertension. The DASH eating plan is a healthy eating plan that has been shown to: Reduce high blood pressure (hypertension). Reduce your risk for type 2 diabetes, heart disease, and stroke. Help with weight loss. What are tips for following this plan? Reading food labels Check food labels for the amount of salt (sodium) per serving. Choose foods with less than 5 percent of the Daily Value of sodium. Generally, foods with less than 300 milligrams (mg) of sodium per serving fit into this eating plan. To find whole grains, look for the word "whole" as the first word in the ingredient list. Shopping Buy products labeled as "low-sodium" or "no salt added." Buy fresh foods. Avoid canned foods and pre-made or frozen meals. Cooking Avoid adding salt when cooking. Use salt-free seasonings or herbs instead of table salt or sea salt. Check with your health care provider or pharmacist before using salt substitutes. Do not fry foods. Cook foods using healthy methods such as baking, boiling, grilling, roasting, and broiling instead. Cook with heart-healthy oils, such as olive, canola, avocado, soybean, or sunflower oil. Meal planning  Eat a balanced diet that includes: 4 or more servings of fruits and 4 or more servings of vegetables each day. Try to fill one-half of your plate with fruits and vegetables. 6-8 servings of whole grains each day. Less than 6 oz (170 g) of lean meat, poultry, or fish each day. A 3-oz (85-g) serving of meat is about the same size as a deck of cards. One egg equals 1 oz (28 g). 2-3 servings of low-fat dairy each day. One serving is 1 cup (237 mL). 1 serving of nuts, seeds, or beans 5 times each week. 2-3 servings of heart-healthy fats. Healthy fats called omega-3 fatty acids are found in foods such as walnuts, flaxseeds, fortified milks, and eggs.  These fats are also found in cold-water fish, such as sardines, salmon, and mackerel. Limit how much you eat of: Canned or prepackaged foods. Food that is high in trans fat, such as some fried foods. Food that is high in saturated fat, such as fatty meat. Desserts and other sweets, sugary drinks, and other foods with added sugar. Full-fat dairy products. Do not salt foods before eating. Do not eat more than 4 egg yolks a week. Try to eat at least 2 vegetarian meals a week. Eat more home-cooked food and less restaurant, buffet, and fast food.  Lifestyle When eating at a restaurant, ask that your food be prepared with less salt or no salt, if possible. If you drink alcohol: Limit how much you use to: 0-1 drink a day for women who are not pregnant. 0-2 drinks a day for men. Be aware of how much alcohol is in your drink. In the [place] one drink equals one 12 oz bottle of beer (355 mL), one 5 oz glass of wine (148 mL), or one 1 oz glass of hard liquor (44 mL). General information Avoid eating more than 2,300 mg of salt a day. If you have hypertension, you may need to reduce your sodium intake to 1,500 mg a day. Work with your health care provider to maintain a healthy body weight or to lose weight. Ask what an ideal weight is for you. Get at least 30 minutes of exercise that causes your heart to beat faster (aerobic exercise) most days of the week. Activities may include walking, swimming, or biking. Work with your health care provider   or dietitian to adjust your eating plan to your individual calorie needs. What foods should I eat? Fruits All fresh, dried, or frozen fruit. Canned fruit in natural juice (without addedsugar). Vegetables Fresh or frozen vegetables (raw, steamed, roasted, or grilled). Low-sodium or reduced-sodium tomato and vegetable juice. Low-sodium or reduced-sodium tomatosauce and tomato paste. Low-sodium or reduced-sodium canned vegetables. Grains Whole-grain or  whole-wheat bread. Whole-grain or whole-wheat pasta. Brown rice. Oatmeal. Quinoa. Bulgur. Whole-grain and low-sodium cereals. Pita bread.Low-fat, low-sodium crackers. Whole-wheat flour tortillas. Meats and other proteins Skinless chicken or turkey. Ground chicken or turkey. Pork with fat trimmed off. Fish and seafood. Egg whites. Dried beans, peas, or lentils. Unsalted nuts, nut butters, and seeds. Unsalted canned beans. Lean cuts of beef with fat trimmed off. Low-sodium, lean precooked or cured meat, such as sausages or meatloaves. Dairy Low-fat (1%) or fat-free (skim) milk. Reduced-fat, low-fat, or fat-free cheeses. Nonfat, low-sodium ricotta or cottage cheese. Low-fat or nonfatyogurt. Low-fat, low-sodium cheese. Fats and oils Soft margarine without trans fats. Vegetable oil. Reduced-fat, low-fat, or light mayonnaise and salad dressings (reduced-sodium). Canola, safflower, olive, avocado, soybean, andsunflower oils. Avocado. Seasonings and condiments Herbs. Spices. Seasoning mixes without salt. Other foods Unsalted popcorn and pretzels. Fat-free sweets. The items listed above may not be a complete list of foods and beverages you can eat. Contact a dietitian for more information. What foods should I avoid? Fruits Canned fruit in a light or heavy syrup. Fried fruit. Fruit in cream or buttersauce. Vegetables Creamed or fried vegetables. Vegetables in a cheese sauce. Regular canned vegetables (not low-sodium or reduced-sodium). Regular canned tomato sauce and paste (not low-sodium or reduced-sodium). Regular tomato and vegetable juice(not low-sodium or reduced-sodium). Pickles. Olives. Grains Baked goods made with fat, such as croissants, muffins, or some breads. Drypasta or rice meal packs. Meats and other proteins Fatty cuts of meat. Ribs. Fried meat. Bacon. Bologna, salami, and other precooked or cured meats, such as sausages or meat loaves. Fat from the back of a pig (fatback). Bratwurst.  Salted nuts and seeds. Canned beans with added salt. Canned orsmoked fish. Whole eggs or egg yolks. Chicken or turkey with skin. Dairy Whole or 2% milk, cream, and half-and-half. Whole or full-fat cream cheese. Whole-fat or sweetened yogurt. Full-fat cheese. Nondairy creamers. Whippedtoppings. Processed cheese and cheese spreads. Fats and oils Butter. Stick margarine. Lard. Shortening. Ghee. Bacon fat. Tropical oils, suchas coconut, palm kernel, or palm oil. Seasonings and condiments Onion salt, garlic salt, seasoned salt, table salt, and sea salt. Worcestershire sauce. Tartar sauce. Barbecue sauce. Teriyaki sauce. Soy sauce, including reduced-sodium. Steak sauce. Canned and packaged gravies. Fish sauce. Oyster sauce. Cocktail sauce. Store-bought horseradish. Ketchup. Mustard. Meat flavorings and tenderizers. Bouillon cubes. Hot sauces. Pre-made or packaged marinades. Pre-made or packaged taco seasonings. Relishes. Regular saladdressings. Other foods Salted popcorn and pretzels. The items listed above may not be a complete list of foods and beverages you should avoid. Contact a dietitian for more information. Where to find more information National Heart, Lung, and Blood Institute: www.nhlbi.nih.gov American Heart Association: www.heart.org Academy of Nutrition and Dietetics: www.eatright.org National Kidney Foundation: www.kidney.org Summary The DASH eating plan is a healthy eating plan that has been shown to reduce high blood pressure (hypertension). It may also reduce your risk for type 2 diabetes, heart disease, and stroke. When on the DASH eating plan, aim to eat more fresh fruits and vegetables, whole grains, lean proteins, low-fat dairy, and heart-healthy fats. With the DASH eating plan, you should limit salt (sodium) intake to 2,300   mg a day. If you have hypertension, you may need to reduce your sodium intake to 1,500 mg a day. Work with your health care provider or dietitian to adjust  your eating plan to your individual calorie needs. This information is not intended to replace advice given to you by your health care provider. Make sure you discuss any questions you have with your healthcare provider. Document Revised: 09/13/2019 Document Reviewed: 09/13/2019 Elsevier Patient Education  2022 [place] for Gastroesophageal Reflux Disease, Adult When you have gastroesophageal reflux disease (GERD), the foods you eat and your eating habits are very important. Choosing the right foods can help ease the discomfort of GERD. Consider working with a dietitian to help you [employer] choices. What are tips for following this plan? Reading food labels Look for foods that are low in saturated fat. Foods that have less than 5% of daily value (DV) of fat and 0 g of trans fats may help with your symptoms. Cooking Cook foods using methods other than frying. This may include baking, steaming, grilling, or broiling. These are all methods that do not need a lot of fat for cooking. To add flavor, try to use herbs that are low in spice and acidity. Meal planning  Choose healthy foods that are low in fat, such as fruits, vegetables, whole grains, low-fat dairy products, lean meats, fish, and poultry. Eat frequent, small meals instead of three large meals each day. Eat your meals slowly, in a relaxed setting. Avoid bending over or lying down until 2-3 hours after eating. Limit high-fat foods such as fatty meats or fried foods. Limit your intake of fatty foods, such as oils, butter, and shortening. Avoid the following as told by your health care provider: Foods that cause symptoms. These may be different for different people. Keep a food diary to keep track of foods that cause symptoms. Alcohol. Drinking large amounts of liquid with meals. Eating meals during the 2-3 hours before bed.  Lifestyle Maintain a healthy weight. Ask your health care provider what weight is  healthy for you. If you need to lose weight, work with your health care provider to do so safely. Exercise for at least 30 minutes on 5 or more days each week, or as told by your health care provider. Avoid wearing clothes that fit tightly around your waist and chest. Do not use any products that contain nicotine or tobacco. These products include cigarettes, chewing tobacco, and vaping devices, such as e-cigarettes. If you need help quitting, ask your health care provider. Sleep with the head of your bed raised. Use a wedge under the mattress or blocks under the bed frame to raise the head of the bed. Chew sugar-free gum after mealtimes. What foods should I eat?  Eat a healthy, well-balanced diet of fruits, vegetables, whole grains, low-fat dairy products, lean meats, fish, and poultry. Each person is different. Foods that may trigger symptoms in one person may not trigger any symptoms in another person. Work with your health care provider to identify foods that are safe foryou. The items listed above may not be a complete list of recommended foods and beverages. Contact a dietitian for more information. What foods should I avoid? Limiting some of these foods may help manage the symptoms of GERD. Everyone is different. Consult a dietitian or your health care provider to help youidentify the exact foods to avoid, if any. Fruits Any fruits prepared with added fat. Any fruits that cause symptoms. For some people  this may include citrus fruits, such as oranges, grapefruit, pineapple,and lemons. Vegetables Deep-fried vegetables. [place] fries. Any vegetables prepared with added fat. Any vegetables that cause symptoms. For some people, this may include tomatoesand tomato products, chili peppers, onions and garlic, and horseradish. Grains Pastries or quick breads with added fat. Meats and other proteins High-fat meats, such as fatty beef or pork, hot dogs, ribs, ham, sausage, salami, and bacon. Fried  meat or protein, including fried fish and friedchicken. Nuts and nut butters, in large amounts. Dairy Whole milk and chocolate milk. Sour cream. Cream. Ice cream. Cream cheese.Milkshakes. Fats and oils Butter. Margarine. Shortening. Ghee. Beverages Coffee and tea, with or without caffeine. Carbonated beverages. Sodas. Energy drinks. Fruit juice made with acidic fruits, such as orange or grapefruit.Tomato juice. Alcoholic drinks. Sweets and desserts Chocolate and cocoa. Donuts. Seasonings and condiments Pepper. Peppermint and spearmint. Added salt. Any condiments, herbs, or seasonings that cause symptoms. For some people, this may include curry, hotsauce, or vinegar-based salad dressings. The items listed above may not be a complete list of foods and beverages to avoid. Contact a dietitian for more information. Questions to ask your health care provider Diet and lifestyle changes are usually the first steps that are taken to manage symptoms of GERD. If diet and lifestyle changes do not improve your symptoms,talk with your health care provider about taking medicines. Where to find more information International Foundation for Gastrointestinal Disorders: aboutgerd.org Summary When you have gastroesophageal reflux disease (GERD), food and lifestyle choices may be very helpful in easing the discomfort of GERD. Eat frequent, small meals instead of three large meals each day. Eat your meals slowly, in a relaxed setting. Avoid bending over or lying down until 2-3 hours after eating. Limit high-fat foods such as fatty meats or fried foods. This information is not intended to replace advice given to you by your health care provider. Make sure you discuss any questions you have with your healthcare provider. Document Revised: 04/20/2020 Document Reviewed: 04/20/2020 Elsevier Patient Education  [place].

## 2021-05-31 NOTE — Progress Notes (Signed)
Subjective:   By signing my name below, I, Zite Okoli, attest that this documentation has been prepared under the direction and in the presence of Roma Schanz R DO. 05/31/2021    Patient ID: Brianna Farrell, female    DOB: 1951/04/30, 70 y.o.   MRN: [id number]  Chief Complaint  Patient presents with   Follow-up    HPI Patient is in today for an office visit. She has not been to the hospital in 3 years and would like a check-up. She mentions she is doing well and has been having a good summer. She mentions she regularly has edema in her hands and ankles when the weather is hot and has not had experienced any unusual swelling. She still has GERD and mentions having pains in her chest and upper stomach during the episodes. She does not take any medication currently to manage the symptoms. She used to manage the reflux with omeprazole but stopped. She reports she snores and often wakes up tired. She is willing to visit a sleep clinic if she has to. She is not willing to get the Covid-19 vaccine. She is UTD on the tetanus vaccines. She participates in regular exercise by walking three times a week for 20-30 minutes. 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 She had surgical excision for basal cell carcinoma.She had pterygium removal in February 2022 and is due for a cataract surgery later this month and in September otherwise no changes to her surgical history.  Past Medical History:  Diagnosis Date   Anemia    Arthritis    Chicken pox    GERD (gastroesophageal reflux disease)    History of stomach ulcers    HTN (hypertension)    Hyperlipidemia    IBS (irritable bowel syndrome)    Macular degeneration    Migraines    Pneumonia    Urine incontinence     Past Surgical History:  Procedure Laterality Date   ABDOMINAL HYSTERECTOMY     Still has Ovaries   APPENDECTOMY  10/25/1983   DILATION AND CURETTAGE OF UTERUS  10/24/1982   EYE SURGERY     SHOULDER SURGERY  Right    Shoulder Manipulation   TONSILLECTOMY AND ADENOIDECTOMY  10/25/1951    Family History  Problem Relation Age of Onset   Arthritis Mother    Osteoarthritis Mother    Rheum arthritis Mother    Kidney disease Mother    Ovarian cancer Cousin        x 2   Breast cancer Maternal Aunt    Hyperlipidemia Father    Heart disease Father    Hyperlipidemia Brother    Heart disease Brother    Stroke Paternal Aunt    Breast cancer Paternal Aunt     Social History   Socioeconomic History   Marital status: Widowed    Spouse name: Not on file   Number of children: 1   Years of education: Not on file   Highest education level: Not on file  Occupational History   Occupation: retired  Tobacco Use   Smoking status: Never   Smokeless tobacco: Never  Substance and Sexual Activity   Alcohol use: No   Drug use: No   Sexual activity: Not on file  Other Topics Concern   Not on file  Social History Narrative   Not on file   Social Determinants of Health   Financial Resource Strain: Low Risk    Difficulty of Paying Living Expenses:  Not very hard  Food Insecurity: No Food Insecurity   Worried About Charity fundraiser in the Last Year: Never true   Ran Out of Food in the Last Year: Never true  Transportation Needs: No Transportation Needs   Lack of Transportation (Medical): No   Lack of Transportation (Non-Medical): No  Physical Activity: Sufficiently Active   Days of Exercise per Week: 6 days   Minutes of Exercise per Session: 30 min  Stress: No Stress Concern Present   Feeling of Stress : Not at all  Social Connections: Moderately Isolated   Frequency of Communication with Friends and Family: More than three times a week   Frequency of Social Gatherings with Friends and Family: More than three times a week   Attends Religious Services: More than 4 times per year   Active Member of [employer] or Organizations: No   Attends Archivist Meetings: Never   Marital Status:  Widowed  Human resources officer Violence: Not At Risk   Fear of Current or Ex-Partner: No   Emotionally Abused: No   Physically Abused: No   Sexually Abused: No    No outpatient medications prior to visit.   No facility-administered medications prior to visit.    Allergies  Allergen Reactions   Ibuprofen Anaphylaxis    REACTION: hives, tongue edema   Nsaids Anaphylaxis    REACTION: hives, tongue edema    Review of Systems  Constitutional:  Negative for chills, fever and malaise/fatigue.  HENT:  Negative for congestion and hearing loss.   Eyes:  Negative for blurred vision and discharge.  Respiratory:  Negative for cough, sputum production and shortness of breath.   Cardiovascular:  Positive for leg swelling. Negative for chest pain and palpitations.  Gastrointestinal:  Positive for abdominal pain (epigastric). Negative for blood in stool, constipation, diarrhea, heartburn, nausea and vomiting.  Genitourinary:  Negative for dysuria, frequency, hematuria and urgency.  Musculoskeletal:  Negative for back pain, falls and myalgias.  Skin:  Negative for rash.  Neurological:  Negative for dizziness, sensory change, loss of consciousness, weakness and headaches.  Endo/Heme/Allergies:  Negative for environmental allergies. Does not bruise/bleed easily.  Psychiatric/Behavioral:  Negative for depression and suicidal ideas. The patient is not nervous/anxious and does not have insomnia.       Objective:    Physical Exam Vitals and nursing note reviewed.  Constitutional:      General: She is not in acute distress.    Appearance: Normal appearance. She is well-developed. She is not ill-appearing or diaphoretic.  HENT:     Head: Normocephalic and atraumatic.     Right Ear: Tympanic membrane, ear canal and external ear normal.     Left Ear: Tympanic membrane, ear canal and external ear normal.     Nose: Nose normal.  Eyes:     General:        Right eye: No discharge.        Left eye: No  discharge.     Extraocular Movements: Extraocular movements intact.     Conjunctiva/sclera: Conjunctivae normal.     Pupils: Pupils are equal, round, and reactive to light.  Neck:     Thyroid: No thyromegaly.     Vascular: No JVD.  Cardiovascular:     Rate and Rhythm: Normal rate and regular rhythm.     Pulses: Normal pulses.     Heart sounds: Normal heart sounds. No murmur heard.   No gallop.  Pulmonary:     Effort: Pulmonary effort  is normal. No respiratory distress.     Breath sounds: Normal breath sounds. No wheezing, rhonchi or rales.  Chest:     Chest wall: No tenderness.  Abdominal:     General: Bowel sounds are normal. There is no distension.     Palpations: Abdomen is soft. There is no mass.     Tenderness: There is abdominal tenderness in the epigastric area. There is no guarding or rebound.     Hernia: No hernia is present.  Musculoskeletal:        General: No tenderness. Normal range of motion.     Cervical back: Normal range of motion and neck supple.  Lymphadenopathy:     Cervical: No cervical adenopathy.  Skin:    General: Skin is warm and dry.     Findings: No erythema or rash.  Neurological:     Mental Status: She is alert and oriented to person, place, and time.     Cranial Nerves: No cranial nerve deficit.     Deep Tendon Reflexes: Reflexes are normal and symmetric.  Psychiatric:        Behavior: Behavior normal.        Thought Content: Thought content normal.        Judgment: Judgment normal.    BP (!) 142/75 (BP Location: Right Arm, Patient Position: Sitting, Cuff Size: Small)   Pulse 72   Temp 98.4 F (36.9 C) (Oral)   Resp 16   Ht '5\' 3"'$  (1.6 m)   Wt 175 lb (79.4 kg)   SpO2 98%   BMI 31.00 kg/m  Wt Readings from Last 3 Encounters:  05/31/21 175 lb (79.4 kg)  04/15/21 173 lb (78.5 kg)  06/05/18 173 lb (78.5 kg)    Diabetic Foot Exam - Simple   No data filed    Lab Results  Component Value Date   WBC 6.2 03/14/2017   HGB 14.1  03/14/2017   HCT 42.5 03/14/2017   PLT 317.0 03/14/2017   GLUCOSE 95 05/29/2018   CHOL 237 (H) 05/29/2018   TRIG 190.0 (H) 05/29/2018   HDL 41.00 05/29/2018   LDLCALC 158 (H) 05/29/2018   ALT 14 05/29/2018   AST 16 05/29/2018   NA 142 05/29/2018   K 4.4 05/29/2018   CL 104 05/29/2018   CREATININE 0.94 05/29/2018   BUN 11 05/29/2018   CO2 31 05/29/2018   TSH 1.46 04/01/2014    Lab Results  Component Value Date   TSH 1.46 04/01/2014   Lab Results  Component Value Date   WBC 6.2 03/14/2017   HGB 14.1 03/14/2017   HCT 42.5 03/14/2017   MCV 83.6 03/14/2017   PLT 317.0 03/14/2017   Lab Results  Component Value Date   NA 142 05/29/2018   K 4.4 05/29/2018   CO2 31 05/29/2018   GLUCOSE 95 05/29/2018   BUN 11 05/29/2018   CREATININE 0.94 05/29/2018   BILITOT 1.3 (H) 05/29/2018   ALKPHOS 67 05/29/2018   AST 16 05/29/2018   ALT 14 05/29/2018   PROT 6.7 05/29/2018   ALBUMIN 4.2 05/29/2018   CALCIUM 9.8 05/29/2018   GFR 62.65 05/29/2018   Lab Results  Component Value Date   CHOL 237 (H) 05/29/2018   Lab Results  Component Value Date   HDL 41.00 05/29/2018   Lab Results  Component Value Date   LDLCALC 158 (H) 05/29/2018   Lab Results  Component Value Date   TRIG 190.0 (H) 05/29/2018   Lab Results  Component  Value Date   CHOLHDL 6 05/29/2018   No results found for: HGBA1C     Assessment & Plan:   Problem List Items Addressed This Visit       Unprioritized   Elevated BP without diagnosis of hypertension    Dash diet Check labs        Gastroesophageal reflux disease    gerd diet HO given to pt pepcid daily Consider GI referral        Relevant Medications   famotidine (PEPCID) 20 MG tablet   Other Relevant Orders   H. pylori antibody, IgG   Hyperlipidemia LDL goal <100    Encourage heart healthy diet such as MIND or DASH diet, increase exercise, avoid trans fats, simple carbohydrates and processed foods, consider a krill or fish or flaxseed  oil cap daily.        Lower extremity edema - Primary    Elevate legs Compression socks  No swelling in legs today       Relevant Orders   Lipid panel   CBC with Differential/Platelet   Comprehensive metabolic panel   TSH   MORBID OBESITY    D/w pt exercise/ diet        Snoring    Refer to neuro for sleep eval        Relevant Orders   Ambulatory referral to Neurology   Other Visit Diagnoses     Hyperlipidemia, unspecified hyperlipidemia type       Relevant Orders   Lipid panel   CBC with Differential/Platelet   Comprehensive metabolic panel   TSH       Meds ordered this encounter  Medications   famotidine (PEPCID) 20 MG tablet    Sig: Take 1 tablet (20 mg total) by mouth 2 (two) times daily.    Dispense:  60 tablet    Refill:  5    I,Zite Okoli,acting as a scribe for [employer], DO.,have documented all relevant documentation on the behalf of Ann Held, DO,as directed by  Ann Held, DO while in the presence of Ann Held, DO.   I, Ann Held DO., personally preformed the services described in this documentation.  All medical record entries made by the scribe were at my direction and in my presence. I have reviewed the chart and discharge instructions (if applicable) and agree that the record reflects my personal performance and is accurate and complete.05/31/2021

## 2021-06-01 ENCOUNTER — Encounter: Payer: Self-pay | Admitting: Family Medicine

## 2021-06-01 DIAGNOSIS — R0683 Snoring: Secondary | ICD-10-CM | POA: Insufficient documentation

## 2021-06-01 DIAGNOSIS — R600 Localized edema: Secondary | ICD-10-CM | POA: Insufficient documentation

## 2021-06-01 DIAGNOSIS — R030 Elevated blood-pressure reading, without diagnosis of hypertension: Secondary | ICD-10-CM | POA: Insufficient documentation

## 2021-06-01 DIAGNOSIS — K219 Gastro-esophageal reflux disease without esophagitis: Secondary | ICD-10-CM | POA: Insufficient documentation

## 2021-06-01 LAB — CBC WITH DIFFERENTIAL/PLATELET
Basophils Absolute: 0.1 10*3/uL (ref 0.0–0.1)
Basophils Relative: 1 % (ref 0.0–3.0)
Eosinophils Absolute: 0.3 10*3/uL (ref 0.0–0.7)
Eosinophils Relative: 4 % (ref 0.0–5.0)
HCT: 43.4 % (ref 36.0–46.0)
Hemoglobin: 14.5 g/dL (ref 12.0–15.0)
Lymphocytes Relative: 41.7 % (ref 12.0–46.0)
Lymphs Abs: 2.8 10*3/uL (ref 0.7–4.0)
MCHC: 33.5 g/dL (ref 30.0–36.0)
MCV: 89.6 fl (ref 78.0–100.0)
Monocytes Absolute: 0.4 10*3/uL (ref 0.1–1.0)
Monocytes Relative: 6.3 % (ref 3.0–12.0)
Neutro Abs: 3.1 10*3/uL (ref 1.4–7.7)
Neutrophils Relative %: 47 % (ref 43.0–77.0)
Platelets: 316 10*3/uL (ref 150.0–400.0)
RBC: 4.85 Mil/uL (ref 3.87–5.11)
RDW: 13 % (ref 11.5–15.5)
WBC: 6.7 10*3/uL (ref 4.0–10.5)

## 2021-06-01 LAB — COMPREHENSIVE METABOLIC PANEL
ALT: 16 U/L (ref 0–35)
AST: 18 U/L (ref 0–37)
Albumin: 4.2 g/dL (ref 3.5–5.2)
Alkaline Phosphatase: 63 U/L (ref 39–117)
BUN: 11 mg/dL (ref 6–23)
CO2: 29 mEq/L (ref 19–32)
Calcium: 9.4 mg/dL (ref 8.4–10.5)
Chloride: 105 mEq/L (ref 96–112)
Creatinine, Ser: 1.1 mg/dL (ref 0.40–1.20)
GFR: 50.18 mL/min — ABNORMAL LOW (ref >60.00)
Glucose, Bld: 100 mg/dL — ABNORMAL HIGH (ref 70–99)
Potassium: 4.5 mEq/L (ref 3.5–5.1)
Sodium: 142 mEq/L (ref 135–145)
Total Bilirubin: 1.2 mg/dL (ref 0.2–1.2)
Total Protein: 6.6 g/dL (ref 6.0–8.3)

## 2021-06-01 LAB — LIPID PANEL
Cholesterol: 220 mg/dL — ABNORMAL HIGH (ref 0–200)
HDL: 42.5 mg/dL (ref >39.00)
LDL Cholesterol: 148 mg/dL — ABNORMAL HIGH (ref 0–99)
NonHDL: 177.03
Total CHOL/HDL Ratio: 5
Triglycerides: 144 mg/dL (ref 0.0–149.0)
VLDL: 28.8 mg/dL (ref 0.0–40.0)

## 2021-06-01 LAB — H. PYLORI ANTIBODY, IGG: H Pylori IgG: NEGATIVE

## 2021-06-01 LAB — TSH: TSH: 2.67 u[IU]/mL (ref 0.35–5.50)

## 2021-06-01 NOTE — Assessment & Plan Note (Signed)
D/w pt exercise/ diet

## 2021-06-01 NOTE — Assessment & Plan Note (Signed)
Refer to neuro for sleep eval

## 2021-06-01 NOTE — Assessment & Plan Note (Signed)
Dash diet Check labs

## 2021-06-01 NOTE — Assessment & Plan Note (Signed)
Elevate legs Compression socks  No swelling in legs today

## 2021-06-01 NOTE — Assessment & Plan Note (Signed)
Encourage heart healthy diet such as MIND or DASH diet, increase exercise, avoid trans fats, simple carbohydrates and processed foods, consider a krill or fish or flaxseed oil cap daily.  °

## 2021-06-01 NOTE — Assessment & Plan Note (Signed)
gerd diet HO given to pt pepcid daily Consider GI referral

## 2021-06-03 ENCOUNTER — Institutional Professional Consult (permissible substitution): Payer: Medicare Other | Admitting: Neurology

## 2021-06-21 DIAGNOSIS — H2512 Age-related nuclear cataract, left eye: Secondary | ICD-10-CM | POA: Diagnosis not present

## 2021-07-08 DIAGNOSIS — H2511 Age-related nuclear cataract, right eye: Secondary | ICD-10-CM | POA: Diagnosis not present

## 2021-08-12 ENCOUNTER — Encounter: Payer: Self-pay | Admitting: Neurology

## 2021-08-12 ENCOUNTER — Ambulatory Visit (INDEPENDENT_AMBULATORY_CARE_PROVIDER_SITE_OTHER): Payer: Medicare Other | Admitting: Neurology

## 2021-08-12 VITALS — BP 151/82 | HR 70 | Ht 63.0 in | Wt 177.5 lb

## 2021-08-12 DIAGNOSIS — R0683 Snoring: Secondary | ICD-10-CM | POA: Diagnosis not present

## 2021-08-12 DIAGNOSIS — I10 Essential (primary) hypertension: Secondary | ICD-10-CM

## 2021-08-12 DIAGNOSIS — K219 Gastro-esophageal reflux disease without esophagitis: Secondary | ICD-10-CM | POA: Diagnosis not present

## 2021-08-12 DIAGNOSIS — G478 Other sleep disorders: Secondary | ICD-10-CM | POA: Diagnosis not present

## 2021-08-12 NOTE — Progress Notes (Signed)
SLEEP MEDICINE CLINIC    Provider:  Larey Seat, MD  Primary Care Physician:  Ann Held, DO [address] [place] [address]     Referring Provider: Carollee Herter, [address] [place],  [place] [address]          Chief Complaint according to patient   Patient presents with:     New Patient (Initial Visit)           HISTORY OF PRESENT ILLNESS:  Brianna Farrell is a 70 year- old Caucasian patient seen here as a referral  by PCP on 08/12/2021  Chief concern according to patient : " I can easily go to sleep, when ever not active and not stimulated. My late husband had sleep apnea and I never thought I would have it, but now I snore- my daughter told me and have woken up by my own snoring, but I am tired and my sleep is not refreshing, I wake with a dry mouth- I never was  a morning person".     Brianna Farrell  has a past medical history of Anemia, Arthritis, Chicken pox, GERD (gastroesophageal reflux disease), History of stomach ulcers, HTN (hypertension), Hyperlipidemia, IBS (irritable bowel syndrome), Macular degeneration, Migraines, Pneumonia, and Urine incontinence..     Sleep relevant medical history: Nocturia one time-, Tonsillectomy at age 72,  Cervical spine- congential fusion , cant sleep flat on my back.     Family medical /sleep history: 2 living brothers, no other family member on CPAP with OSA. Chronic migraine in  daughter with insomnia.    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 In his last years he was demented and wandering.  She had been working as Pharmacist, hospital, Engineer, petroleum, Museum/gallery conservator,  and lives in a household alone, one dog.  Tobacco QVZ:DGLO  .  ETOH use none ,  Caffeine intake in form of Coffee( /) Soda( Dr Malachi Bonds 1-2 a day) Tea ( /) and no energy drinks. Regular exercise in form of walking, 30 minutes a day.     Sleep habits are as follows: The patient's dinner time is between 6-8 PM. The patient goes to bed at 10-12 PM , but she had some night up to 4 AM., averages 6 hours of sleep. Her dog will wake her to be let outside.   The preferred sleep position is in a recliner , and she tends to turn to the right side. with the support of a crumbled comforter instead of  pillows. Recliner sleeper due to back problems, hasn't slept in a bed in a long time, recliner in the den , not the bedroom.  Has GERD.  The TV is on a lot.  Plays games on the phone when she cant sleep Dreams are reportedly frequent.  9-10  AM is the usual rise time. The patient wakes up spontaneously hours before.   She reports not feeling refreshed or restored in AM, with symptoms such as dry mouth, pressure morning headaches, sinus headaches and residual fatigue.  Naps are taken infrequently, lasting from 1 to 2 hours- and are more  refreshing than nocturnal sleep.    Review of Systems: Out of a complete 14 system review, the patient complains of only the following symptoms, and all other reviewed systems are negative.:  Fatigue, sleepiness ,  snoring, fragmented sleep, fluctuation in sleep time, rise time, bed time.   Recliner sleeper due to back problems, hasn't slept in a bed in a long time, recliner in the den , not the bedroom.  The TV is on a lot.  Plays games on the phone when she cant sleep .   Has GERD and avoids eating at night- feels that food stays in her her hiatus.   How likely are you to doze in the following situations: 0 = not likely, 1 = slight chance, 2 = moderate chance, 3 = high chance   Sitting and Reading? Watching Television? Sitting inactive in a public place (theater or meeting)? As a passenger in a car for an hour without a break? Lying down in the afternoon when circumstances permit? Sitting and talking to someone? Sitting quietly after lunch without alcohol? In a car, while stopped for a few  minutes in traffic?   Total = 10/ 24 points   FSS endorsed at 18/ 63 points.   Social History   Socioeconomic History   Marital status: Widowed    Spouse name: Not on file   Number of children: 1   Years of education: Not on file   Highest education level: Not on file  Occupational History   Occupation: retired  Tobacco Use   Smoking status: Never   Smokeless tobacco: Never  Substance and Sexual Activity   Alcohol use: No   Drug use: No   Sexual activity: Not on file  Other Topics Concern   Not on file  Social History Narrative   Lives alone   Right handed   Caffeine: 20 oz of dr. Malachi Bonds, no coffee   Social Determinants of Health   Financial Resource Strain: Low Risk    Difficulty of Paying Living Expenses: Not very hard  Food Insecurity: No Food Insecurity   Worried About Running Out of Food in the Last Year: Never true   [place] in the Last Year: Never true  Transportation Needs: No Transportation Needs   Lack of Transportation (Medical): No   Lack of Transportation (Non-Medical): No  Physical Activity: Sufficiently Active   Days of Exercise per Week: 6 days   Minutes of Exercise per Session: 30 min  Stress: No Stress Concern Present   Feeling of Stress : Not at all  Social Connections: Moderately Isolated   Frequency of Communication with Friends and Family: More than three times a week   Frequency of Social Gatherings with Friends and Family: More than three times a week   Attends Religious Services: More than 4 times per year   Active Member of [employer] or Organizations: No   Attends Archivist Meetings: Never   Marital Status: Widowed    Family History  Problem Relation Age of Onset   Arthritis Mother    Osteoarthritis Mother    Rheum arthritis Mother    Kidney disease Mother    Hyperlipidemia Father    Heart disease Father    Hyperlipidemia Brother    Heart disease Brother    Breast cancer Maternal Aunt    Stroke Paternal Aunt     Breast cancer Paternal Aunt    Ovarian cancer Cousin        x 2    Past Medical History:  Diagnosis Date   Anemia    Arthritis    Chicken pox    GERD (gastroesophageal reflux disease)    History of stomach ulcers  HTN (hypertension)    Hyperlipidemia    IBS (irritable bowel syndrome)    Macular degeneration    Migraines    Pneumonia    Urine incontinence     Past Surgical History:  Procedure Laterality Date   ABDOMINAL HYSTERECTOMY     Still has Ovaries   APPENDECTOMY  10/25/1983   DILATION AND CURETTAGE OF UTERUS  10/24/1982   EYE SURGERY     SHOULDER SURGERY Right    Shoulder Manipulation   TONSILLECTOMY AND ADENOIDECTOMY  10/25/1951     Current Outpatient Medications on File Prior to Visit  Medication Sig Dispense Refill   famotidine (PEPCID) 20 MG tablet Take 1 tablet (20 mg total) by mouth 2 (two) times daily. 60 tablet 5   No current facility-administered medications on file prior to visit.    Allergies  Allergen Reactions   Ibuprofen Anaphylaxis    REACTION: hives, tongue edema   Nsaids Anaphylaxis    REACTION: hives, tongue edema    Physical exam:  Today's Vitals   08/12/21 1242  BP: (!) 151/82  Pulse: 70  Weight: 177 lb 8 oz (80.5 kg)  Height: 5\' 3"  (1.6 m)   Body mass index is 31.44 kg/m.   Wt Readings from Last 3 Encounters:  08/12/21 177 lb 8 oz (80.5 kg)  05/31/21 175 lb (79.4 kg)  04/15/21 173 lb (78.5 kg)     Ht Readings from Last 3 Encounters:  08/12/21 5\' 3"  (1.6 m)  05/31/21 5\' 3"  (1.6 m)  04/15/21 5\' 3"  (1.6 m)      General: The patient is awake, alert and appears not in acute distress. The patient is well groomed. Head: Normocephalic, atraumatic. Neck is supple. Mallampati 2,  neck circumference:15.5  inches .  Nasal airflow is patent.  Retrognathia is mild. Dental status: implants. Used to have a partial.  Cardiovascular:  Regular rate and cardiac rhythm by pulse,  without distended neck veins. Respiratory: Lungs  are clear to auscultation.  Skin:  Without evidence of ankle edema, or rash. Trunk: The patient's posture is erect.   Neurologic exam : The patient is awake and alert, oriented to place and time.   Memory subjective described as intact.  Attention span & concentration ability appears normal.  Speech is fluent,  without  dysarthria, dysphonia or aphasia.  Mood and affect are appropriate.   Cranial nerves: no loss of smell or taste reported  Pupils are equal and briskly reactive to light.  Right side ptosis and status post cataract and blepharoplasty.  Funduscopic exam deferred. .  Extraocular movements in vertical and horizontal planes were intact and without nystagmus. No Diplopia. Visual fields by finger perimetry are intact. Hearing was intact to soft voice and finger rubbing.   Facial sensation intact to fine touch.  Facial motor strength is symmetric and tongue and uvula move midline.  Neck ROM : rotation, tilt and flexion extension were normal for age and shoulder shrug was symmetrical.    Motor exam:  Symmetric bulk, tone and ROM.   Normal tone without cog- wheeling, symmetric grip strength .   Sensory:  Fine touch and vibration were intact  Proprioception tested in the upper extremities was normal.   Coordination: Rapid alternating movements in the fingers/hands were of normal speed.  The Finger-to-nose maneuver was intact without evidence of ataxia, dysmetria or tremor.   Gait and station: Patient could rise unassisted from a seated position, walked without assistive device.  Stance is of normal width/ base  and the patient turned with 3 steps.  Toe and heel walk were deferred.  Deep tendon reflexes: in the  upper and lower extremities are symmetric and intact.  Babinski response was deferred.   Dr. Rhett Bannister has provided me Barrett's lab results and the patient had in the past sometimes tested for high cholesterol and high triglyceride levels LDH in 2019 was 158 which is  considered high creatinine and BUN were normal range her thyroid tests have returned normal seems to be no evidence of any kind of liver injury in the past but she did have at one time the bilirubin of 1.3 which is tiny elevation.    Recent labs state total chol at 220. LDL 148, GFR is low now, 50.18- normal AST and ALT  Normal CBC, TSH : 2.67. negative h pylori.   Her primary care physician has encouraged healthy diet such as a mind or Dash diet, some increasing exercise.  There has been some weight gain over the last 10 years and this may also have contributed to additional tendency to snore and choke.  As she sleeps in a recliner I suspect that snoring is most evident if her chin drops to her chest there is rather a choking sensation.     After spending a total time of : 45  minutes: Including  face to face and additional time for physical and neurologic examination, review of laboratory studies,  personal review of imaging studies, reports and results of other testing and review of referral information / records as far as provided in visit, I have established the following assessments:  1)  very likely OSA is present, for certain that she is snoring, has GERD and is not having any restorative sleep.  2)   Back Pain, joint pain- sleeping in a recliner.  RLS, not even weekly, the feeling that her legs can't rest 3)  sleep hygiene is not the best- set a rise time and have the bedtime follow, try to sleep in a bedroom again, eliminate TV or put on a timer.    My Plan is to proceed with:  1) HST allows for sleep in a recliner. PSG could be done in Bed 2 of [facility].    I would like to thank Carollee Herter, Alferd Apa, DO and [place], [address],  [place] [address] for allowing me to meet with and to take care of this pleasant patient.   In short, Brianna Farrell is presenting with fatigue, latent sleepiness and unrestful sleep ,I plan to follow up either  personally or through our NP within 2-4  month.  .  Electronically signed by: Larey Seat, MD 08/12/2021 1:09 PM  [facility] Board certified by The [employer] of Sleep Medicine and Diplomate of the [employer] of Sleep Medicine. Board certified In Neurology through the [place], Fellow of the [employer] of Neurology. Medical Director of [facility].

## 2021-08-12 NOTE — Patient Instructions (Signed)

## 2021-08-23 ENCOUNTER — Ambulatory Visit (INDEPENDENT_AMBULATORY_CARE_PROVIDER_SITE_OTHER): Payer: Medicare Other | Admitting: Neurology

## 2021-08-23 DIAGNOSIS — G4733 Obstructive sleep apnea (adult) (pediatric): Secondary | ICD-10-CM

## 2021-08-23 DIAGNOSIS — K219 Gastro-esophageal reflux disease without esophagitis: Secondary | ICD-10-CM

## 2021-08-23 DIAGNOSIS — G478 Other sleep disorders: Secondary | ICD-10-CM

## 2021-08-23 DIAGNOSIS — G4734 Idiopathic sleep related nonobstructive alveolar hypoventilation: Secondary | ICD-10-CM

## 2021-08-23 DIAGNOSIS — R0683 Snoring: Secondary | ICD-10-CM

## 2021-08-23 DIAGNOSIS — I10 Essential (primary) hypertension: Secondary | ICD-10-CM

## 2021-08-26 NOTE — Progress Notes (Signed)
[facility] TEST REPORT ( by Watch PAT)   STUDY DATA:  available 08-27-2021 DOB:  [birth year]   ORDERING CLINICIAN: Larey Seat, MD  REFERRING CLINICIAN: Roma Schanz, DO   CLINICAL INFORMATION/HISTORY: seen on 08-12-2021, this 70 year-old patient has a history of non-restorative sleep, elevated blood pressure, cardiac palpitations and obesity , also snoring and GERD.  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 " I can easily go to sleep, when ever not active and not stimulated. My late husband had sleep apnea and I never thought I would have it, but now I snore- my daughter told me and have woken up by my own snoring, but I am tired and my sleep is not refreshing, I wake with a dry mouth- I never was  a morning person".    Epworth sleepiness score: 10 /24.  FSS at 18/63 points.  BMI: 31.44 kg/m Neck Circumference: 15.5"   FINDINGS:    Total Recording Time (hours, min): * 8 hours and 3 minutes.  The calculated total sleep time amounted to 7 hours and 23 minutes.  24.2% of the sleep time were REM sleep.                             Respiratory Indices:   Calculated pAHI (per hour): Calculated apnea-hypopnea index was 22.1/h . in REM sleep this amounted to 46/h and in non-REM sleep to 14.2/h.  Positional AHI was 35.7 in supine position and 64.3 in nonsupine position.  The highest apnea-hypopnea index was witnessed during prone sleep at 28.9/h.                          Snoring was present for 32.3% of the total sleep time with a mean volume of 42 dB.                                                   Oxygen Saturation Statistics:   O2 Saturation Range (%): A nadir of oxygen saturation was at 75% the maximum saturation at 97% and the mean oxygen saturation was 90%.                                     O2 Saturation (minutes) <89%:    41.8 minutes, the equivalent of 13.9% of total sleep time.    Oxygen saturation under 90% amounted to 86.7 minutes -the equivalent of 19.5% of total sleep time.   Pulse Rate Statistics:    Pulse Range:      Varied between 52 and 89 bpm with a mean heart rate of 63 bpm.  Please note that this home sleep test is not able to give cardiac rhythm data only the heart rate is reflected.           IMPRESSION:  This HST confirms the presence of hypoxemia with moderate severe sleep apnea.  The AHI was higher in REM sleep than in non-REM sleep and was the highest improved sleep by positional differentiation. This hypoxia makes CPAP the preferred treatment option.    RECOMMENDATION: Auto titration CPAP with a  setting between 6 and 16 cmH2O 3 cm EPR and a DreamWear or ResMed F 30 I mask of patient's choice.  The CPAP has to provide heated humidification for the machine itself as well as for the tubing. An overnight pulse oximetry will be ordered through the same DME that provides the machine.  Pulse oximetry is to be performed while the patient uses CPAP to document if hypoxia is truly resolved.    INTERPRETING PHYSICIAN:  Larey Seat, MD  Medical Director of [facility] at [facility].

## 2021-08-27 DIAGNOSIS — G4733 Obstructive sleep apnea (adult) (pediatric): Secondary | ICD-10-CM | POA: Insufficient documentation

## 2021-08-27 DIAGNOSIS — G4734 Idiopathic sleep related nonobstructive alveolar hypoventilation: Secondary | ICD-10-CM | POA: Insufficient documentation

## 2021-08-27 NOTE — Procedures (Signed)
[facility] TEST REPORT ( by Watch PAT)   STUDY DATA:  available 08-27-2021 DOB:  Jul 01, 1951   ORDERING CLINICIAN: Larey Seat, MD  REFERRING CLINICIAN: Roma Schanz, DO   CLINICAL INFORMATION/HISTORY: seen on 08-12-2021, this 70 year-old patient has a history of non-restorative sleep, elevated blood pressure, cardiac palpitations and obesity , also snoring and GERD.  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 " I can easily go to sleep, when ever not active and not stimulated. My late husband had sleep apnea and I never thought I would have it, but now I snore- my daughter told me and have woken up by my own snoring, but I am tired and my sleep is not refreshing, I wake with a dry mouth- I never was  a morning person".    Epworth sleepiness score: 10 /24.  FSS at 18/63 points.  BMI: 31.44 kg/m Neck Circumference: 15.5"   FINDINGS:    Total Recording Time (hours, min): * 8 hours and 3 minutes.  The calculated total sleep time amounted to 7 hours and 23 minutes.  24.2% of the sleep time were REM sleep.                             Respiratory Indices:   Calculated pAHI (per hour): Calculated apnea-hypopnea index was 22.1/h . in REM sleep this amounted to 46/h and in non-REM sleep to 14.2/h.  Positional AHI was 35.7 in supine position and 64.3 in nonsupine position.  The highest apnea-hypopnea index was witnessed during prone sleep at 28.9/h.                          Snoring was present for 32.3% of the total sleep time with a mean volume of 42 dB.                                                   Oxygen Saturation Statistics:   O2 Saturation Range (%): A nadir of oxygen saturation was at 75% the maximum saturation at 97% and the mean oxygen saturation was 90%.                                     O2 Saturation (minutes) <89%:  41.8 minutes, the equivalent of 13.9% of total sleep time.   Oxygen  saturation <90% amounted to 86.7 minutes -the equivalent of 19.5% of total sleep time.   Pulse Rate Statistics:    Pulse Range varied between 52 and 89 bpm with a mean heart rate of 63 bpm.  Please note that this home sleep test is not able to give cardiac rhythm data only the heart rate is reflected.           IMPRESSION:  This HST confirms the presence of hypoxemia with moderate severe sleep apnea.  The AHI was higher in REM sleep than in non-REM sleep and was the highest improved sleep by positional differentiation. This hypoxia makes CPAP the preferred treatment option.    RECOMMENDATION: Auto titration CPAP with a setting between 6 and 16 cmH2O 3 cm EPR  and a DreamWear or ResMed F 30 I mask of patient's choice.  The CPAP has to provide heated humidification for the machine itself as well as for the tubing. An overnight pulse oximetry will be ordered through the same DME that provides the machine.  Pulse oximetry is to be performed while the patient uses CPAP to document if hypoxia is truly resolved.    INTERPRETING PHYSICIAN:  Larey Seat, MD  Medical Director of [facility] at [facility].

## 2021-08-27 NOTE — Addendum Note (Signed)
Addended by: Larey Seat on: 08/27/2021 03:01 PM   Modules accepted: Orders

## 2021-08-27 NOTE — Progress Notes (Signed)
IMPRESSION:  This HST confirms the presence of hypoxemia with moderate severe sleep apnea.  The AHI was higher in REM sleep than in non-REM sleep and was the highest improved sleep by positional differentiation. This hypoxia makes CPAP the preferred treatment option.   RECOMMENDATION: Auto titration CPAP with a setting between 6 and 16 cmH2O 3 cm EPR and a DreamWear or ResMed F 30 I mask of patient's choice.  The CPAP has to provide heated humidification for the machine itself as well as for the tubing. An overnight pulse oximetry will be ordered through the same DME that provides the machine.  Pulse oximetry is to be performed while the patient uses CPAP to document if hypoxia is truly resolved.

## 2021-08-28 ENCOUNTER — Encounter: Payer: Self-pay | Admitting: Neurology

## 2021-08-30 ENCOUNTER — Telehealth: Payer: Self-pay | Admitting: Neurology

## 2021-08-30 NOTE — Telephone Encounter (Signed)
I called pt. I advised pt that Dr. Brett Fairy reviewed their sleep study results and found that pt has sleep apnea. Dr. Brett Fairy recommends that pt starts auto CPAP. I reviewed PAP compliance expectations with the pt. Pt is agreeable to starting a CPAP. I advised pt that an order will be sent to a DME, [facility], and [facility] will call the pt within about one week after they file with the pt's insurance. [facility] will show the pt how to use the machine, fit for masks, and troubleshoot the CPAP if needed. A follow up appt was made for insurance purposes with Debbora Presto, NP on 12/01/2021 at 2 pm. Pt verbalized understanding to arrive 15 minutes early and bring their CPAP. A letter with all of this information in it will be mailed to the pt as a reminder. I verified with the pt that the address we have on file is correct. Pt verbalized understanding of results. Pt had no questions at this time but was encouraged to call back if questions arise. I have sent the order to [facility] and have received confirmation that they have received the order.

## 2021-08-30 NOTE — Progress Notes (Signed)
CM sent to [facility]

## 2021-08-30 NOTE — Telephone Encounter (Signed)
-----   Message from Larey Seat, MD sent at 08/27/2021  3:01 PM EDT ----- IMPRESSION:  This HST confirms the presence of hypoxemia with moderate severe sleep apnea.  The AHI was higher in REM sleep than in non-REM sleep and was the highest improved sleep by positional differentiation. This hypoxia makes CPAP the preferred treatment option.   RECOMMENDATION: Auto titration CPAP with a setting between 6 and 16 cmH2O 3 cm EPR and a DreamWear or ResMed F 30 I mask of patient's choice.  The CPAP has to provide heated humidification for the machine itself as well as for the tubing. An overnight pulse oximetry will be ordered through the same DME that provides the machine.  Pulse oximetry is to be performed while the patient uses CPAP to document if hypoxia is truly resolved.

## 2021-09-22 ENCOUNTER — Telehealth: Payer: Self-pay | Admitting: Neurology

## 2021-09-22 NOTE — Telephone Encounter (Signed)
Pt was scheduled for Initial CPAP visit on 11/11/21. Pt was informed to bring machine and power cord to appt.  DME: [facility] Phone: [phone] Fax: [phone] Equipment issued: Bennett Scrape on 09/22/21 Pt to be scheduled between: 10/23/21-12/21/21

## 2021-11-02 DIAGNOSIS — N958 Other specified menopausal and perimenopausal disorders: Secondary | ICD-10-CM | POA: Diagnosis not present

## 2021-11-02 DIAGNOSIS — K219 Gastro-esophageal reflux disease without esophagitis: Secondary | ICD-10-CM | POA: Diagnosis not present

## 2021-11-02 DIAGNOSIS — Z6829 Body mass index (BMI) 29.0-29.9, adult: Secondary | ICD-10-CM | POA: Diagnosis not present

## 2021-11-02 DIAGNOSIS — Z01419 Encounter for gynecological examination (general) (routine) without abnormal findings: Secondary | ICD-10-CM | POA: Diagnosis not present

## 2021-11-02 DIAGNOSIS — Z1231 Encounter for screening mammogram for malignant neoplasm of breast: Secondary | ICD-10-CM | POA: Diagnosis not present

## 2021-11-02 DIAGNOSIS — M8588 Other specified disorders of bone density and structure, other site: Secondary | ICD-10-CM | POA: Diagnosis not present

## 2021-11-02 LAB — HM MAMMOGRAPHY

## 2021-11-04 ENCOUNTER — Other Ambulatory Visit: Payer: Self-pay | Admitting: Neurology

## 2021-11-04 NOTE — Telephone Encounter (Signed)
I have reached out to the DME company to have them check on the status and reach out to the patient about completing this test.

## 2021-11-04 NOTE — Telephone Encounter (Signed)
Pt states she never received pulse oximetry and wants to know if that is something she needs before coming to her appointment next week on 1/19. Please advise.

## 2021-11-08 ENCOUNTER — Encounter: Payer: Self-pay | Admitting: Family Medicine

## 2021-11-09 ENCOUNTER — Encounter: Payer: Self-pay | Admitting: Neurology

## 2021-11-10 DIAGNOSIS — R0683 Snoring: Secondary | ICD-10-CM | POA: Diagnosis not present

## 2021-11-10 DIAGNOSIS — G4730 Sleep apnea, unspecified: Secondary | ICD-10-CM | POA: Diagnosis not present

## 2021-11-10 NOTE — Patient Instructions (Addendum)
Please continue using your CPAP regularly. While your insurance requires that you use CPAP at least 4 hours each night on 70% of the nights, I recommend, that you not skip any nights and use it throughout the night if you can. Getting used to CPAP and staying with the treatment long term does take time and patience and discipline. Untreated obstructive sleep apnea when it is moderate to severe can have an adverse impact on cardiovascular health and raise her risk for heart disease, arrhythmias, hypertension, congestive heart failure, stroke and diabetes. Untreated obstructive sleep apnea causes sleep disruption, nonrestorative sleep, and sleep deprivation. This can have an impact on your day to day functioning and cause daytime sleepiness and impairment of cognitive function, memory loss, mood disturbance, and problems focussing. Using CPAP regularly can improve these symptoms.  We will watch for oxygen study results.   Follow up in 1 year

## 2021-11-10 NOTE — Progress Notes (Signed)
PATIENT: Brianna Farrell DOB: 04-07-1951  REASON FOR VISIT: follow up HISTORY FROM: patient  Chief Complaint  Patient presents with   Follow-up    RM 16, alone. Here for initial cpap.  Feels she is having air leaks.  She did pulse oximetry test and [facility] picked up back up yesterday. Results pending. Aware we will reach out once we receive results.     HISTORY OF PRESENT ILLNESS:  11/11/21 ALL:  Brianna Farrell is a 71 y.o. female here today for follow up for OSA on CPAP.  She was seen in consult with Dr Brett Fairy 07/2021 for snoring and daytime sleepiness. HST 08/23/2021 showed OSA with AHI 22.1/hr with hypoxemia. AutoPAP ordered. She is doing well. She is tolerating CPAP. She feels that she is resting better and waking more refreshed. She had ONO two nights ago and awaiting results. She is using dreamwear FFM. She feels it fits most of the time but may need to be adjusted.     HISTORY: (copied from Dr Dohmeier's previous note)  Brianna Farrell is a 71 year- old Caucasian patient seen here as a referral  by PCP on 08/12/2021  Chief concern according to patient : " I can easily go to sleep, when ever not active and not stimulated. My late husband had sleep apnea and I never thought I would have it, but now I snore- my daughter told me and have woken up by my own snoring, but I am tired and my sleep is not refreshing, I wake with a dry mouth- I never was  a morning person".    Brianna Farrell  has a past medical history of Anemia, Arthritis, Chicken pox, GERD (gastroesophageal reflux disease), History of stomach ulcers, HTN (hypertension), Hyperlipidemia, IBS (irritable bowel syndrome), Macular degeneration, Migraines, Pneumonia, and Urine incontinence.   Sleep relevant medical history: Nocturia one time-, Tonsillectomy at age 36,  Cervical spine- congential fusion , cant sleep flat on my back.     Family medical /sleep history: 2 living brothers, no other family member on CPAP with OSA. Chronic  migraine in  daughter with insomnia.    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 In his last years he was demented and wandering.  She had been working as Pharmacist, hospital, Engineer, petroleum, Museum/gallery conservator,  and lives in a household alone, one dog.  Tobacco VCB:SWHQ  .  ETOH use none ,  Caffeine intake in form of Coffee( /) Soda( Dr Malachi Bonds 1-2 a day) Tea ( /) and no energy drinks. Regular exercise in form of walking, 30 minutes a day.    Sleep habits are as follows: The patient's dinner time is between 6-8 PM. The patient goes to bed at 10-12 PM , but she had some night up to 4 AM., averages 6 hours of sleep. Her dog will wake her to be let outside.    The preferred sleep position is in a recliner , and she tends to turn to the right side. with the support of a crumbled comforter instead of  pillows. Recliner sleeper due to back problems, hasn't slept in a bed in a long time, recliner in the den , not the bedroom.  Has GERD.  The TV is on a lot.  Plays games on the phone when she cant sleep Dreams are reportedly frequent.  9-10  AM is the usual rise time. The patient wakes  up spontaneously hours before.   She reports not feeling refreshed or restored in AM, with symptoms such as dry mouth, pressure morning headaches, sinus headaches and residual fatigue.  Naps are taken infrequently, lasting from 1 to 2 hours- and are more  refreshing than nocturnal sleep.    REVIEW OF SYSTEMS: Out of a complete 14 system review of symptoms, the patient complains only of the following symptoms, none and all other reviewed systems are negative.  ESS: 10  ALLERGIES: Allergies  Allergen Reactions   Ibuprofen Anaphylaxis    REACTION: hives, tongue edema   Nsaids Anaphylaxis    REACTION: hives, tongue edema    HOME MEDICATIONS: Outpatient Medications Prior to Visit  Medication Sig Dispense Refill   famotidine (PEPCID) 20 MG  tablet Take 1 tablet (20 mg total) by mouth 2 (two) times daily. 60 tablet 5   No facility-administered medications prior to visit.    PAST MEDICAL HISTORY: Past Medical History:  Diagnosis Date   Anemia    Arthritis    Chicken pox    GERD (gastroesophageal reflux disease)    History of stomach ulcers    HTN (hypertension)    Hyperlipidemia    IBS (irritable bowel syndrome)    Macular degeneration    Migraines    Pneumonia    Urine incontinence     PAST SURGICAL HISTORY: Past Surgical History:  Procedure Laterality Date   ABDOMINAL HYSTERECTOMY     Still has Ovaries   APPENDECTOMY  10/25/1983   DILATION AND CURETTAGE OF UTERUS  10/24/1982   EYE SURGERY     SHOULDER SURGERY Right    Shoulder Manipulation   TONSILLECTOMY AND ADENOIDECTOMY  10/25/1951    FAMILY HISTORY: Family History  Problem Relation Age of Onset   Arthritis Mother    Osteoarthritis Mother    Rheum arthritis Mother    Kidney disease Mother    Hyperlipidemia Father    Heart disease Father    Hyperlipidemia Brother    Heart disease Brother    Breast cancer Maternal Aunt    Stroke Paternal Aunt    Breast cancer Paternal Aunt    Ovarian cancer Cousin        x 2    SOCIAL HISTORY: Social History   Socioeconomic History   Marital status: Widowed    Spouse name: Not on file   Number of children: 1   Years of education: Not on file   Highest education level: Not on file  Occupational History   Occupation: retired  Tobacco Use   Smoking status: Never   Smokeless tobacco: Never  Substance and Sexual Activity   Alcohol use: No   Drug use: No   Sexual activity: Not on file  Other Topics Concern   Not on file  Social History Narrative   Lives alone   Right handed   Caffeine: 20 oz of dr. Malachi Bonds, no coffee   Social Determinants of Health   Financial Resource Strain: Low Risk    Difficulty of Paying Living Expenses: Not very hard  Food Insecurity: No Food Insecurity   Worried About  Running Out of Food in the Last Year: Never true   [place] in the Last Year: Never true  Transportation Needs: No Transportation Needs   Lack of Transportation (Medical): No   Lack of Transportation (Non-Medical): No  Physical Activity: Sufficiently Active   Days of Exercise per Week: 6 days   Minutes of Exercise per Session: 30 min  Stress: No Stress Concern Present   Feeling of Stress : Not at all  Social Connections: Moderately Isolated   Frequency of Communication with Friends and Family: More than three times a week   Frequency of Social Gatherings with Friends and Family: More than three times a week   Attends Religious Services: More than 4 times per year   Active Member of [employer] or Organizations: No   Attends Archivist Meetings: Never   Marital Status: Widowed  Human resources officer Violence: Not At Risk   Fear of Current or Ex-Partner: No   Emotionally Abused: No   Physically Abused: No   Sexually Abused: No     PHYSICAL EXAM  Vitals:   11/11/21 1113  BP: 110/60  Pulse: 70  SpO2: 98%  Weight: 172 lb (78 kg)  Height: 5\' 3"  (1.6 m)   Body mass index is 30.47 kg/m.  Generalized: Well developed, in no acute distress  Cardiology: normal rate and rhythm, no murmur noted Respiratory: clear to auscultation bilaterally  Neurological examination  Mentation: Alert oriented to time, place, history taking. Follows all commands speech and language fluent Cranial nerve II-XII: Pupils were equal round reactive to light. Extraocular movements were full, visual field were full  Motor: The motor testing reveals 5 over 5 strength of all 4 extremities. Good symmetric motor tone is noted throughout.  Gait and station: Gait is normal.    DIAGNOSTIC DATA (LABS, IMAGING, TESTING) - I reviewed patient records, labs, notes, testing and imaging myself where available.  MMSE - Mini Mental State Exam 09/27/2016  Orientation to time 5  Orientation to Place 5  Registration  3  Attention/ Calculation 5  Recall 3  Language- name 2 objects 2  Language- repeat 1  Language- follow 3 step command 3  Language- read & follow direction 1  Write a sentence 1  Copy design 1  Total score 30     Lab Results  Component Value Date   WBC 6.7 05/31/2021   HGB 14.5 05/31/2021   HCT 43.4 05/31/2021   MCV 89.6 05/31/2021   PLT 316.0 05/31/2021      Component Value Date/Time   NA 142 05/31/2021 1337   K 4.5 05/31/2021 1337   CL 105 05/31/2021 1337   CO2 29 05/31/2021 1337   GLUCOSE 100 (H) 05/31/2021 1337   BUN 11 05/31/2021 1337   CREATININE 1.10 05/31/2021 1337   CALCIUM 9.4 05/31/2021 1337   PROT 6.6 05/31/2021 1337   ALBUMIN 4.2 05/31/2021 1337   AST 18 05/31/2021 1337   ALT 16 05/31/2021 1337   ALKPHOS 63 05/31/2021 1337   BILITOT 1.2 05/31/2021 1337   Lab Results  Component Value Date   CHOL 220 (H) 05/31/2021   HDL 42.50 05/31/2021   LDLCALC 148 (H) 05/31/2021   TRIG 144.0 05/31/2021   CHOLHDL 5 05/31/2021   No results found for: HGBA1C No results found for: VITAMINB12 Lab Results  Component Value Date   TSH 2.67 05/31/2021     ASSESSMENT AND PLAN 71 y.o. year old female  has a past medical history of Anemia, Arthritis, Chicken pox, GERD (gastroesophageal reflux disease), History of stomach ulcers, HTN (hypertension), Hyperlipidemia, IBS (irritable bowel syndrome), Macular degeneration, Migraines, Pneumonia, and Urine incontinence. here with     ICD-10-CM   1. OSA on CPAP  G47.33    Z99.89         Brianna Farrell is doing well on CPAP therapy. Compliance report reveals excellent compliance. She  was encouraged to continue using CPAP nightly and for greater than 4 hours each night. I will look for ONO results and add O2 if needed. She is feeling much better and waking more refreshed. She will continue to monitor for leak. No significant leak noted on compliance report. Risks of untreated sleep apnea review and education materials provided.  Healthy lifestyle habits encouraged. She will follow up in 1 year, sooner if needed. She verbalizes understanding and agreement with this plan.    No orders of the defined types were placed in this encounter.    No orders of the defined types were placed in this encounter.     Debbora Presto, FNP-C 11/11/2021, 12:51 PM [facility] [address], [place], [place] [address] [phone]

## 2021-11-11 ENCOUNTER — Encounter: Payer: Self-pay | Admitting: Family Medicine

## 2021-11-11 ENCOUNTER — Telehealth: Payer: Self-pay | Admitting: Neurology

## 2021-11-11 ENCOUNTER — Ambulatory Visit (INDEPENDENT_AMBULATORY_CARE_PROVIDER_SITE_OTHER): Payer: Medicare Other | Admitting: Family Medicine

## 2021-11-11 VITALS — BP 110/60 | HR 70 | Ht 63.0 in | Wt 172.0 lb

## 2021-11-11 DIAGNOSIS — Z9989 Dependence on other enabling machines and devices: Secondary | ICD-10-CM | POA: Diagnosis not present

## 2021-11-11 DIAGNOSIS — G4733 Obstructive sleep apnea (adult) (pediatric): Secondary | ICD-10-CM

## 2021-11-11 DIAGNOSIS — R0683 Snoring: Secondary | ICD-10-CM

## 2021-11-11 DIAGNOSIS — G478 Other sleep disorders: Secondary | ICD-10-CM

## 2021-11-11 NOTE — Telephone Encounter (Signed)
Received the ONO on CPAP report for the patient. Pt had total recording time of 6 hr 39 min. There was a total of 20 min and 32 sec that was spent below 88%. Based off of this information the patient would benefit from oxygen being added to the CPAP. Pt would have to per medicare guidelines come in for a titration to oxygen. I will contact the patient to inform her of this.

## 2021-11-15 ENCOUNTER — Encounter: Payer: Self-pay | Admitting: Neurology

## 2021-11-15 NOTE — Telephone Encounter (Signed)
Called the pt to review the results. There was no answer. LVM advising she can call back or check the mychart message.

## 2021-11-18 NOTE — Telephone Encounter (Signed)
Called the patient again and there was no answer. Left a message advising for pt to either call back or review the mychart message sent.   **If the patient calls back, the overnight oximetry test she completed on CPAP showed she still has concerns for low oxygen. In order to treat this we would have to completed a titration study in the sleep lab where it will allow the technician to add the oxygen and titrate where it needs to go.

## 2021-11-18 NOTE — Addendum Note (Signed)
Addended by: Darleen Crocker on: 11/18/2021 11:50 AM   Modules accepted: Orders

## 2021-11-30 ENCOUNTER — Ambulatory Visit (INDEPENDENT_AMBULATORY_CARE_PROVIDER_SITE_OTHER): Payer: Medicare Other | Admitting: Neurology

## 2021-11-30 DIAGNOSIS — G4733 Obstructive sleep apnea (adult) (pediatric): Secondary | ICD-10-CM | POA: Diagnosis not present

## 2021-11-30 DIAGNOSIS — G478 Other sleep disorders: Secondary | ICD-10-CM

## 2021-11-30 DIAGNOSIS — R0683 Snoring: Secondary | ICD-10-CM

## 2021-12-01 ENCOUNTER — Ambulatory Visit: Payer: BLUE CROSS/BLUE SHIELD | Admitting: Family Medicine

## 2021-12-07 ENCOUNTER — Ambulatory Visit (INDEPENDENT_AMBULATORY_CARE_PROVIDER_SITE_OTHER): Payer: Medicare Other | Admitting: Family Medicine

## 2021-12-07 ENCOUNTER — Encounter: Payer: Self-pay | Admitting: Family Medicine

## 2021-12-07 VITALS — BP 114/80 | HR 69 | Temp 98.4°F | Resp 16 | Ht 63.0 in | Wt 171.0 lb

## 2021-12-07 DIAGNOSIS — E785 Hyperlipidemia, unspecified: Secondary | ICD-10-CM | POA: Diagnosis not present

## 2021-12-07 DIAGNOSIS — K219 Gastro-esophageal reflux disease without esophagitis: Secondary | ICD-10-CM | POA: Diagnosis not present

## 2021-12-07 DIAGNOSIS — G4733 Obstructive sleep apnea (adult) (pediatric): Secondary | ICD-10-CM

## 2021-12-07 DIAGNOSIS — R030 Elevated blood-pressure reading, without diagnosis of hypertension: Secondary | ICD-10-CM | POA: Diagnosis not present

## 2021-12-07 NOTE — Assessment & Plan Note (Signed)
On pepcid  stable

## 2021-12-07 NOTE — Assessment & Plan Note (Signed)
Resolved since using cpap

## 2021-12-07 NOTE — Assessment & Plan Note (Signed)
On cpap 

## 2021-12-07 NOTE — Progress Notes (Signed)
Subjective:   By signing my name below, I, Shehryar Baig, attest that this documentation has been prepared under the direction and in the presence of Ann Held, DO  12/07/2021    Patient ID: Brianna Farrell, female    DOB: 09-25-1951, 71 y.o.   MRN: [id number]  Chief Complaint  Patient presents with   Hypertension   Follow-up    Hypertension Pertinent negatives include no blurred vision, chest pain, headaches, malaise/fatigue, palpitations or shortness of breath.  Patient is in today for a office visit.  Her blood pressure is doing well during this visit. She reports no new issues with swelling in her legs.  BP Readings from Last 3 Encounters:  12/07/21 114/80  11/11/21 110/60  08/12/21 (!) 151/82   Pulse Readings from Last 3 Encounters:  12/07/21 69  11/11/21 70  08/12/21 70   She continues seeing a sleep specialist at this time. She continues wearing a CPAP machine. She has difficulties sleeping with the mask but otherwise she has no new issues.  She continues taking 20 mg Pepcid 2x daily PO and reports no new  issues while taking it.  She has difficulty exercising regularly due to pain from her arthritis.  She completed a mammogram in January, 2023.  She also completed her bone density scan this year and reports finding osteopenia.  She is UTD on pneumonia vaccines. She is not interested in receiving the flu vaccine. She is not interested in receiving the Covid-19 vaccine.    Past Medical History:  Diagnosis Date   Anemia    Arthritis    Chicken pox    GERD (gastroesophageal reflux disease)    History of stomach ulcers    HTN (hypertension)    Hyperlipidemia    IBS (irritable bowel syndrome)    Macular degeneration    Migraines    Pneumonia    Urine incontinence     Past Surgical History:  Procedure Laterality Date   ABDOMINAL HYSTERECTOMY     Still has Ovaries   APPENDECTOMY  10/25/1983   DILATION AND CURETTAGE OF UTERUS  10/24/1982   EYE  SURGERY     SHOULDER SURGERY Right    Shoulder Manipulation   TONSILLECTOMY AND ADENOIDECTOMY  10/25/1951    Family History  Problem Relation Age of Onset   Arthritis Mother    Osteoarthritis Mother    Rheum arthritis Mother    Kidney disease Mother    Hyperlipidemia Father    Heart disease Father    Hyperlipidemia Brother    Heart disease Brother    Breast cancer Maternal Aunt    Stroke Paternal Aunt    Breast cancer Paternal Aunt    Ovarian cancer Cousin        x 2    Social History   Socioeconomic History   Marital status: Widowed    Spouse name: Not on file   Number of children: 1   Years of education: Not on file   Highest education level: Not on file  Occupational History   Occupation: retired  Tobacco Use   Smoking status: Never   Smokeless tobacco: Never  Substance and Sexual Activity   Alcohol use: No   Drug use: No   Sexual activity: Not on file  Other Topics Concern   Not on file  Social History Narrative   Lives alone   Right handed   Caffeine: 20 oz of dr. Malachi Bonds, no coffee   Social Determinants of Health  Financial Resource Strain: Low Risk    Difficulty of Paying Living Expenses: Not very hard  Food Insecurity: No Food Insecurity   Worried About Charity fundraiser in the Last Year: Never true   Ran Out of Food in the Last Year: Never true  Transportation Needs: No Transportation Needs   Lack of Transportation (Medical): No   Lack of Transportation (Non-Medical): No  Physical Activity: Sufficiently Active   Days of Exercise per Week: 6 days   Minutes of Exercise per Session: 30 min  Stress: No Stress Concern Present   Feeling of Stress : Not at all  Social Connections: Moderately Isolated   Frequency of Communication with Friends and Family: More than three times a week   Frequency of Social Gatherings with Friends and Family: More than three times a week   Attends Religious Services: More than 4 times per year   Active Member of [employer]  or Organizations: No   Attends Archivist Meetings: Never   Marital Status: Widowed  Human resources officer Violence: Not At Risk   Fear of Current or Ex-Partner: No   Emotionally Abused: No   Physically Abused: No   Sexually Abused: No    Outpatient Medications Prior to Visit  Medication Sig Dispense Refill   famotidine (PEPCID) 20 MG tablet Take 1 tablet (20 mg total) by mouth 2 (two) times daily. 60 tablet 5   No facility-administered medications prior to visit.    Allergies  Allergen Reactions   Ibuprofen Anaphylaxis    REACTION: hives, tongue edema   Nsaids Anaphylaxis    REACTION: hives, tongue edema    Review of Systems  Constitutional:  Negative for fever and malaise/fatigue.  HENT:  Negative for congestion.   Eyes:  Negative for blurred vision.  Respiratory:  Negative for shortness of breath.   Cardiovascular:  Negative for chest pain, palpitations and leg swelling.  Gastrointestinal:  Negative for abdominal pain, blood in stool and nausea.  Genitourinary:  Negative for dysuria and frequency.  Musculoskeletal:  Negative for falls.  Skin:  Negative for rash.  Neurological:  Negative for dizziness, loss of consciousness and headaches.  Endo/Heme/Allergies:  Negative for environmental allergies.  Psychiatric/Behavioral:  Negative for depression. The patient is not nervous/anxious.       Objective:    Physical Exam Vitals and nursing note reviewed.  Constitutional:      General: She is not in acute distress.    Appearance: Normal appearance. She is not ill-appearing.  HENT:     Head: Normocephalic and atraumatic.     Right Ear: External ear normal.     Left Ear: External ear normal.  Eyes:     Extraocular Movements: Extraocular movements intact.     Pupils: Pupils are equal, round, and reactive to light.  Cardiovascular:     Rate and Rhythm: Normal rate and regular rhythm.     Heart sounds: Normal heart sounds. No murmur heard.   No gallop.   Pulmonary:     Effort: Pulmonary effort is normal. No respiratory distress.     Breath sounds: Normal breath sounds. No wheezing or rales.  Skin:    General: Skin is warm and dry.  Neurological:     Mental Status: She is alert and oriented to person, place, and time.  Psychiatric:        Behavior: Behavior normal.        Judgment: Judgment normal.    BP 114/80 (BP Location: Right Arm, Patient Position:  Sitting, Cuff Size: Normal)    Pulse 69    Temp 98.4 F (36.9 C) (Oral)    Resp 16    Ht 5\' 3"  (1.6 m)    Wt 171 lb (77.6 kg)    SpO2 98%    BMI 30.29 kg/m  Wt Readings from Last 3 Encounters:  12/07/21 171 lb (77.6 kg)  11/11/21 172 lb (78 kg)  08/12/21 177 lb 8 oz (80.5 kg)    Diabetic Foot Exam - Simple   No data filed    Lab Results  Component Value Date   WBC 6.7 05/31/2021   HGB 14.5 05/31/2021   HCT 43.4 05/31/2021   PLT 316.0 05/31/2021   GLUCOSE 100 (H) 05/31/2021   CHOL 220 (H) 05/31/2021   TRIG 144.0 05/31/2021   HDL 42.50 05/31/2021   LDLCALC 148 (H) 05/31/2021   ALT 16 05/31/2021   AST 18 05/31/2021   NA 142 05/31/2021   K 4.5 05/31/2021   CL 105 05/31/2021   CREATININE 1.10 05/31/2021   BUN 11 05/31/2021   CO2 29 05/31/2021   TSH 2.67 05/31/2021    Lab Results  Component Value Date   TSH 2.67 05/31/2021   Lab Results  Component Value Date   WBC 6.7 05/31/2021   HGB 14.5 05/31/2021   HCT 43.4 05/31/2021   MCV 89.6 05/31/2021   PLT 316.0 05/31/2021   Lab Results  Component Value Date   NA 142 05/31/2021   K 4.5 05/31/2021   CO2 29 05/31/2021   GLUCOSE 100 (H) 05/31/2021   BUN 11 05/31/2021   CREATININE 1.10 05/31/2021   BILITOT 1.2 05/31/2021   ALKPHOS 63 05/31/2021   AST 18 05/31/2021   ALT 16 05/31/2021   PROT 6.6 05/31/2021   ALBUMIN 4.2 05/31/2021   CALCIUM 9.4 05/31/2021   GFR 50.18 (L) 05/31/2021   Lab Results  Component Value Date   CHOL 220 (H) 05/31/2021   Lab Results  Component Value Date   HDL 42.50 05/31/2021    Lab Results  Component Value Date   LDLCALC 148 (H) 05/31/2021   Lab Results  Component Value Date   TRIG 144.0 05/31/2021   Lab Results  Component Value Date   CHOLHDL 5 05/31/2021   No results found for: HGBA1C     Assessment & Plan:   Problem List Items Addressed This Visit       Unprioritized   MORBID OBESITY   Elevated BP without diagnosis of hypertension    Resolved since using cpap      Relevant Orders   Lipid panel   Comprehensive metabolic panel   Gastroesophageal reflux disease    On pepcid  stable      OSA (obstructive sleep apnea)    On cpap       Other Visit Diagnoses     Hyperlipidemia, unspecified hyperlipidemia type    -  Primary   Relevant Orders   Lipid panel   Comprehensive metabolic panel        No orders of the defined types were placed in this encounter.   I, Ann Held, DO, personally preformed the services described in this documentation.  All medical record entries made by the scribe were at my direction and in my presence.  I have reviewed the chart and discharge instructions (if applicable) and agree that the record reflects my personal performance and is accurate and complete. 12/07/2021   I,Shehryar Baig,acting as a Education administrator for [employer]  Carollee Herter, DO.,have documented all relevant documentation on the behalf of Ann Held, DO,as directed by  Ann Held, DO while in the presence of Ann Held, DO.   Ann Held, DO

## 2021-12-07 NOTE — Patient Instructions (Signed)
Cholesterol Content in Foods ?Cholesterol is a waxy, fat-like substance that helps to carry fat in the blood. The body needs cholesterol in small amounts, but too much cholesterol can cause damage to the arteries and heart. ?What foods have cholesterol? ?Cholesterol is found in animal-based foods, such as meat, seafood, and dairy. Generally, low-fat dairy and lean meats have less cholesterol than full-fat dairy and fatty meats. The milligrams of cholesterol per serving (mg per serving) of common cholesterol-containing foods are listed below. ?Meats and other proteins ?Egg -- one large whole egg has 186 mg. ?Veal shank -- 4 oz (113 g) has 141 mg. ?Lean ground turkey (93% lean) -- 4 oz (113 g) has 118 mg. ?Fat-trimmed lamb loin -- 4 oz (113 g) has 106 mg. ?Lean ground beef (90% lean) -- 4 oz (113 g) has 100 mg. ?Lobster -- 3.5 oz (99 g) has 90 mg. ?Pork loin chops -- 4 oz (113 g) has 86 mg. ?Canned salmon -- 3.5 oz (99 g) has 83 mg. ?Fat-trimmed beef top loin -- 4 oz (113 g) has 78 mg. ?Frankfurter -- 1 frank (3.5 oz or 99 g) has 77 mg. ?Crab -- 3.5 oz (99 g) has 71 mg. ?Roasted chicken without skin, white meat -- 4 oz (113 g) has 66 mg. ?Light bologna -- 2 oz (57 g) has 45 mg. ?Deli-cut turkey -- 2 oz (57 g) has 31 mg. ?Canned tuna -- 3.5 oz (99 g) has 31 mg. ?Bacon -- 1 oz (28 g) has 29 mg. ?Oysters and mussels (raw) -- 3.5 oz (99 g) has 25 mg. ?Mackerel -- 1 oz (28 g) has 22 mg. ?Trout -- 1 oz (28 g) has 20 mg. ?Pork sausage -- 1 link (1 oz or 28 g) has 17 mg. ?Salmon -- 1 oz (28 g) has 16 mg. ?Tilapia -- 1 oz (28 g) has 14 mg. ?Dairy ?Soft-serve ice cream -- ? cup (4 oz or 86 g) has 103 mg. ?Whole-milk yogurt -- 1 cup (8 oz or 245 g) has 29 mg. ?Cheddar cheese -- 1 oz (28 g) has 28 mg. ?American cheese -- 1 oz (28 g) has 28 mg. ?Whole milk -- 1 cup (8 oz or 250 mL) has 23 mg. ?2% milk -- 1 cup (8 oz or 250 mL) has 18 mg. ?Cream cheese -- 1 tablespoon (Tbsp) (14.5 g) has 15 mg. ?Cottage cheese -- ? cup (4 oz or 113  g) has 14 mg. ?Low-fat (1%) milk -- 1 cup (8 oz or 250 mL) has 10 mg. ?Sour cream -- 1 Tbsp (12 g) has 8.5 mg. ?Low-fat yogurt -- 1 cup (8 oz or 245 g) has 8 mg. ?Nonfat Greek yogurt -- 1 cup (8 oz or 228 g) has 7 mg. ?Half-and-half cream -- 1 Tbsp (15 mL) has 5 mg. ?Fats and oils ?Cod liver oil -- 1 tablespoon (Tbsp) (13.6 g) has 82 mg. ?Butter -- 1 Tbsp (14 g) has 15 mg. ?Lard -- 1 Tbsp (12.8 g) has 14 mg. ?Bacon grease -- 1 Tbsp (12.9 g) has 14 mg. ?Mayonnaise -- 1 Tbsp (13.8 g) has 5-10 mg. ?Margarine -- 1 Tbsp (14 g) has 3-10 mg. ?The items listed above may not be a complete list of foods with cholesterol. Exact amounts of cholesterol in these foods may vary depending on specific ingredients and brands. Contact a dietitian for more information. ?What foods do not have cholesterol? ?Most plant-based foods do not have cholesterol unless you combine them with a food that has cholesterol.   Foods without cholesterol include: ?Grains and cereals. ?Vegetables. ?Fruits. ?Vegetable oils, such as olive, canola, and sunflower oil. ?Legumes, such as peas, beans, and lentils. ?Nuts and seeds. ?Egg whites. ?The items listed above may not be a complete list of foods that do not have cholesterol. Contact a dietitian for more information. ?Summary ?The body needs cholesterol in small amounts, but too much cholesterol can cause damage to the arteries and heart. ?Cholesterol is found in animal-based foods, such as meat, seafood, and dairy. Generally, low-fat dairy and lean meats have less cholesterol than full-fat dairy and fatty meats. ?This information is not intended to replace advice given to you by your health care provider. Make sure you discuss any questions you have with your health care provider. ?Document Revised: 02/19/2021 Document Reviewed: 02/19/2021 ?Elsevier Patient Education ? 2022 Elsevier Inc. ? ?

## 2021-12-09 LAB — COMPREHENSIVE METABOLIC PANEL
ALT: 14 U/L (ref 0–35)
AST: 18 U/L (ref 0–37)
Albumin: 4.3 g/dL (ref 3.5–5.2)
Alkaline Phosphatase: 67 U/L (ref 39–117)
BUN: 9 mg/dL (ref 6–23)
CO2: 34 mEq/L — ABNORMAL HIGH (ref 19–32)
Calcium: 9.7 mg/dL (ref 8.4–10.5)
Chloride: 105 mEq/L (ref 96–112)
Creatinine, Ser: 1.09 mg/dL (ref 0.40–1.20)
GFR: 50.54 mL/min — ABNORMAL LOW (ref >60.00)
Glucose, Bld: 99 mg/dL (ref 70–99)
Potassium: 4.4 mEq/L (ref 3.5–5.1)
Sodium: 141 mEq/L (ref 135–145)
Total Bilirubin: 1.1 mg/dL (ref 0.2–1.2)
Total Protein: 6.7 g/dL (ref 6.0–8.3)

## 2021-12-09 LAB — LIPID PANEL
Cholesterol: 185 mg/dL (ref 0–200)
HDL: 43.5 mg/dL (ref >39.00)
LDL Cholesterol: 113 mg/dL — ABNORMAL HIGH (ref 0–99)
NonHDL: 141.77
Total CHOL/HDL Ratio: 4
Triglycerides: 145 mg/dL (ref 0.0–149.0)
VLDL: 29 mg/dL (ref 0.0–40.0)

## 2021-12-16 ENCOUNTER — Telehealth: Payer: Self-pay | Admitting: Neurology

## 2021-12-16 DIAGNOSIS — G478 Other sleep disorders: Secondary | ICD-10-CM | POA: Insufficient documentation

## 2021-12-16 NOTE — Procedures (Signed)
PATIENT'S NAME:  Donnalynn, Wheeless DOB:      [birth year]      MR#:    [id number]     DATE OF RECORDING: 11/30/2021 [name] REFERRING M.D.:  Rosalita Chessman [place], [place] Study Performed:   CPAP  Titration HISTORY:  Sanjuana Mruk is a 71 year- old Caucasian patient who was seen on 11-11-2021 by Amy Lomax,NP  she follows up on a HST  on 08-27-2021 - HST confirms the presence of hypoxemia with moderate severe sleep apnea.  The AHI was higher in REM sleep than in non-REM sleep and was the highest improved sleep by positional differentiation. RECOMMENDATION: Auto titration CPAP with a setting between 6 and 16 cmH2O 3 cm EPR and a DreamWear or ResMed F 30 I mask of patient's choice.  The CPAP has to provide heated humidification for the machine itself as well as for the tubing. ONO was non diagnostic and raised question of need of CPAP.  An overnight study will be ordered to document if CPAP truly resolved hypoxia. There are some concerns that the patient may no longer have the same baseline condition.    Epworth sleepiness score: 10 /24.  FSS at 18/63 points.  BMI: 31.44 kg/m Neck Circumference: 15.5".     CURRENT MEDICATIONS: Pepcid    PROCEDURE:  This is a multichannel digital polysomnogram utilizing the SomnoStar 11.2 system.  Electrodes and sensors were applied and monitored per AASM Specifications.   EEG, EOG, Chin and Limb EMG, were sampled at 200 Hz.  ECG, Snore and Nasal Pressure, Thermal Airflow, Respiratory Effort, CPAP Flow and Pressure, Oximetry was sampled at 50 Hz. Digital video and audio were recorded.      CPAP was not initiated -At a PAP pressure of 0 cmH20, there was a reduction of the AHI to 0 with improvement of sleep apnea.  Lights Out was at 20:43 and Lights On at 05:00. Total recording time (TRT) was 497 minutes, with a total sleep time (TST) of 387.5 minutes. The patient's sleep latency was 84 minutes. REM latency was 86.5 minutes.  The sleep efficiency was 78.0 %.    SLEEP  ARCHITECTURE: WASO (Wake after sleep onset) was 44.5 minutes.  There were 12 minutes in Stage N1, 214 minutes Stage N2, 92.5 minutes Stage N3 and 69 minutes in Stage REM.  The percentage of Stage N1 was 3.1%, Stage N2 was 55.2%, Stage N3 was 23.9% and Stage R (REM sleep) was 17.8%.      RESPIRATORY ANALYSIS:  There was a total of 0 respiratory events: 0 obstructive apneas, 0 central apneas and 0 mixed apneas with a total of 0 apneas and an apnea index (AI) of 0 /hour. There were 0 hypopneas with a hypopnea index of 0/hour. The patient also had 0 respiratory event related arousals (RERAs).      The total APNEA/HYPOPNEA INDEX  (AHI) was 0 /hour and the total RESPIRATORY DISTURBANCE INDEX was 0 /hour  0 events occurred in REM sleep and 0 events in NREM. The REM AHI was 0 /hour versus a non-REM AHI of 0 /hour.  The patient spent 0 minutes of total sleep time in the supine position and 388 minutes in non-supine. The supine AHI was 0.0, versus a non-supine AHI of 0.0.  OXYGEN SATURATION & C02:  The baseline 02 saturation was 94%, with the lowest being 87%. Time spent below 89% saturation equaled 7 minutes.  PERIODIC LIMB MOVEMENTS:  The patient had a total of 0 Periodic Limb Movements.  The arousals were noted as: 20 were spontaneous, 0 were associated with PLMs, 0 were associated with respiratory events. EKG was in keeping with normal sinus rhythm (NSR).   DIAGNOSIS There is no Obstructive Sleep Apnea present, neither significant hypoxia. This is in contrast to the HST, which the patient felt was not representative of usual sleep.    PLANS/RECOMMENDATIONS: This patient does not need CPAP ! I like to speak to her about how these different results could come about.   DISCUSSION: A follow up appointment can be scheduled in the Sleep Clinic at [facility].   Please call [phone] with any questions.      I certify that I have reviewed the entire raw data recording prior to the  issuance of this report in accordance with the Standards of Accreditation of the American Academy of Sleep Medicine (AASM)    Larey Seat, M.D. Diplomat, Tax adviser of Psychiatry and Neurology  Diplomat, Tax adviser of Sleep Medicine Market researcher, [employer] [facility] at [employer]

## 2021-12-16 NOTE — Telephone Encounter (Signed)
PATIENT'S NAME:  Brianna Farrell, Brianna Farrell DOB:      Apr 07, 1951      MR#:    [id number]     DATE OF RECORDING: 11/30/2021 Brianna Farrell REFERRING M.D.:  Ann Held, DO Study Performed:   CPAP  Titration  This is the correct report for CPAP night study, for Brianna Farrell.   HISTORY:  Brianna Farrell is a 71 year- old Caucasian patient who was seen on 11-11-2021 by Amy Lomax,NP  she follows up on a HST  on 08-27-2021 - HST confirms the presence of hypoxemia with moderate severe sleep apnea.  The AHI was higher in REM sleep than in non-REM sleep and was the highest improved sleep by positional differentiation. RECOMMENDATION: Auto titration CPAP with a setting between 6 and 16 cmH2O 3 cm EPR and a DreamWear or ResMed F 30 I mask of patient's choice.  The CPAP has to provide heated humidification for the machine itself as well as for the tubing.  An overnight study will be ordered to document if CPAP truly resolved hypoxia. BMI: 31.44 kg/m Neck Circumference: 15.5".     CURRENT MEDICATIONS: Pepcid    PROCEDURE:  This is a multichannel digital polysomnogram utilizing the SomnoStar 11.2 system.  Electrodes and sensors were applied and monitored per AASM Specifications.   EEG, EOG, Chin and Limb EMG, were sampled at 200 Hz.  ECG, Snore and Nasal Pressure, Thermal Airflow, Respiratory Effort, CPAP Flow and Pressure, Oximetry was sampled at 50 Hz. Digital video and audio were recorded.      CPAP was initiated at 7 cm water and not increased -At a PAP pressure of 7 cmH20, there was a reduction of the AHI to 0 with improvement of sleep apnea and hypoxia.  Lights Out was at 20:43 and Lights On at 05:00. Total recording time (TRT) was 497 minutes, with a total sleep time (TST) of 387.5 minutes. The patient's sleep latency was 84 minutes. REM latency was 86.5 minutes.  The sleep efficiency was 78.0 %.    SLEEP ARCHITECTURE: WASO (Wake after sleep onset) was 44.5 minutes.  There were 12 minutes in Stage N1, 214  minutes Stage N2, 92.5 minutes Stage N3 and 69 minutes in Stage REM.  The percentage of Stage N1 was 3.1%, Stage N2 was 55.2%, Stage N3 was 23.9% and Stage R (REM sleep) was 17.8%.   RESPIRATORY ANALYSIS:  There was a total of 0 respiratory events: 0 obstructive apneas, 0 central apneas and 0 mixed apneas with a total of 0 apneas and an apnea index (AI) of 0 /hour. There were 0 hypopneas with a hypopnea index of 0/hour. The patient also had 0 respiratory event related arousals (RERAs).      The total APNEA/HYPOPNEA INDEX  (AHI) was 0 /hour and the total RESPIRATORY DISTURBANCE INDEX was 0 /hour  0 events occurred in REM sleep and 0 events in NREM. The REM AHI was 0 /hour versus a non-REM AHI of 0 /hour.  The patient spent 0 minutes of total sleep time in the supine position and 388 minutes in non-supine. The supine AHI was 0.0, versus a non-supine AHI of 0.0.  OXYGEN SATURATION & C02:  The baseline 02 saturation was 94%, with the lowest being 87%. Time spent below 89% saturation equaled 7 minutes.  PERIODIC LIMB MOVEMENTS:  The patient had a total of 0 Periodic Limb Movements. The arousals were noted as: 20 were spontaneous, 0 were associated with PLMs, 0 were associated with respiratory events. EKG  was in keeping with normal sinus rhythm (NSR).   DIAGNOSIS There is no Obstructive Sleep Apnea present, neither significant hypoxia. This is under CPAP at 7 cm water pressure.   PLANS/RECOMMENDATIONS: This patient does not need additional oxygen to CPAP at 7 cm water pressure ! I DISCUSSION: A follow up appointment can be scheduled in the Sleep Clinic at [facility].   Please call [phone] with any questions.      I certify that I have reviewed the entire raw data recording prior to the issuance of this report in accordance with the Standards of Accreditation of the American Academy of Sleep Medicine (AASM)    Larey Seat, M.D. Diplomat, Tax adviser of Psychiatry and  Neurology  Diplomat, Tax adviser of Sleep Medicine Market researcher, [employer] [facility] at [employer]

## 2021-12-20 NOTE — Telephone Encounter (Signed)
Reviewed with the patient the correct results. Pt verbalized understanding. Pt had no questions at this time but was encouraged to call back if questions arise.

## 2021-12-27 NOTE — Progress Notes (Signed)
Mrs. Brianna Farrell Sleep study showed that 7 cm water CPAP was sufficient for control of all apnea events.

## 2022-01-12 ENCOUNTER — Telehealth: Payer: Self-pay | Admitting: Neurology

## 2022-01-17 ENCOUNTER — Encounter: Payer: Self-pay | Admitting: Neurology

## 2022-01-17 ENCOUNTER — Telehealth: Payer: Self-pay | Admitting: Family Medicine

## 2022-01-17 NOTE — Telephone Encounter (Signed)
Pt called stating that [facility] has informed her again that they have not received the compliance notes from her Initial Cpap Visit on 11/11/21 ?Please advise.  ?

## 2022-01-17 NOTE — Telephone Encounter (Signed)
We never had to send notes in as [facility] has access to epic and can pull the notes themselves. I have reached out to the management team to make them aware that pt is calling telling us this and have asked that someone contact her to review this with her.  ?

## 2022-03-30 DIAGNOSIS — H52229 Regular astigmatism, unspecified eye: Secondary | ICD-10-CM | POA: Diagnosis not present

## 2022-03-30 DIAGNOSIS — Z8669 Personal history of other diseases of the nervous system and sense organs: Secondary | ICD-10-CM | POA: Diagnosis not present

## 2022-03-30 DIAGNOSIS — H04123 Dry eye syndrome of bilateral lacrimal glands: Secondary | ICD-10-CM | POA: Diagnosis not present

## 2022-03-30 DIAGNOSIS — H40059 Ocular hypertension, unspecified eye: Secondary | ICD-10-CM | POA: Diagnosis not present

## 2022-03-30 DIAGNOSIS — H353131 Nonexudative age-related macular degeneration, bilateral, early dry stage: Secondary | ICD-10-CM | POA: Diagnosis not present

## 2022-04-06 ENCOUNTER — Encounter (INDEPENDENT_AMBULATORY_CARE_PROVIDER_SITE_OTHER): Payer: Medicare Other | Admitting: Ophthalmology

## 2022-04-06 DIAGNOSIS — H353121 Nonexudative age-related macular degeneration, left eye, early dry stage: Secondary | ICD-10-CM | POA: Diagnosis not present

## 2022-04-06 DIAGNOSIS — H353112 Nonexudative age-related macular degeneration, right eye, intermediate dry stage: Secondary | ICD-10-CM

## 2022-04-06 DIAGNOSIS — H43813 Vitreous degeneration, bilateral: Secondary | ICD-10-CM

## 2022-04-06 DIAGNOSIS — H33303 Unspecified retinal break, bilateral: Secondary | ICD-10-CM | POA: Diagnosis not present

## 2022-04-18 ENCOUNTER — Ambulatory Visit (INDEPENDENT_AMBULATORY_CARE_PROVIDER_SITE_OTHER): Payer: Medicare Other

## 2022-04-18 VITALS — Ht 63.0 in | Wt 171.0 lb

## 2022-04-18 DIAGNOSIS — Z0000 Encounter for general adult medical examination without abnormal findings: Secondary | ICD-10-CM | POA: Diagnosis not present

## 2022-04-19 ENCOUNTER — Encounter: Payer: Self-pay | Admitting: Family Medicine

## 2022-04-19 ENCOUNTER — Ambulatory Visit (INDEPENDENT_AMBULATORY_CARE_PROVIDER_SITE_OTHER): Payer: Medicare Other | Admitting: Family Medicine

## 2022-04-19 VITALS — BP 126/86 | HR 73 | Temp 97.4°F | Resp 18 | Ht 63.0 in | Wt 175.2 lb

## 2022-04-19 DIAGNOSIS — K649 Unspecified hemorrhoids: Secondary | ICD-10-CM

## 2022-04-19 DIAGNOSIS — E2839 Other primary ovarian failure: Secondary | ICD-10-CM

## 2022-04-19 DIAGNOSIS — M2550 Pain in unspecified joint: Secondary | ICD-10-CM | POA: Diagnosis not present

## 2022-04-19 DIAGNOSIS — M791 Myalgia, unspecified site: Secondary | ICD-10-CM

## 2022-04-19 MED ORDER — HYDROCORTISONE ACETATE 25 MG RE SUPP: 25.0000 mg | Freq: Two times a day (BID) | RECTAL | 0 refills | Status: DC

## 2022-04-20 ENCOUNTER — Telehealth: Payer: Self-pay | Admitting: Family Medicine

## 2022-04-20 LAB — CBC WITH DIFFERENTIAL/PLATELET
Basophils Absolute: 0 10*3/uL (ref 0.0–0.1)
Basophils Relative: 0.4 % (ref 0.0–3.0)
Eosinophils Absolute: 0.3 10*3/uL (ref 0.0–0.7)
Eosinophils Relative: 3.8 % (ref 0.0–5.0)
HCT: 40.9 % (ref 36.0–46.0)
Hemoglobin: 13.7 g/dL (ref 12.0–15.0)
Lymphocytes Relative: 41.9 % (ref 12.0–46.0)
Lymphs Abs: 2.9 10*3/uL (ref 0.7–4.0)
MCHC: 33.5 g/dL (ref 30.0–36.0)
MCV: 89.9 fl (ref 78.0–100.0)
Monocytes Absolute: 0.5 10*3/uL (ref 0.1–1.0)
Monocytes Relative: 7.6 % (ref 3.0–12.0)
Neutro Abs: 3.2 10*3/uL (ref 1.4–7.7)
Neutrophils Relative %: 46.3 % (ref 43.0–77.0)
Platelets: 301 10*3/uL (ref 150.0–400.0)
RBC: 4.55 Mil/uL (ref 3.87–5.11)
RDW: 12.8 % (ref 11.5–15.5)
WBC: 6.9 10*3/uL (ref 4.0–10.5)

## 2022-04-20 LAB — SEDIMENTATION RATE: Sed Rate: 13 mm/hr (ref 0–30)

## 2022-04-20 LAB — COMPREHENSIVE METABOLIC PANEL
ALT: 18 U/L (ref 0–35)
AST: 19 U/L (ref 0–37)
Albumin: 4.4 g/dL (ref 3.5–5.2)
Alkaline Phosphatase: 59 U/L (ref 39–117)
BUN: 14 mg/dL (ref 6–23)
CO2: 29 mEq/L (ref 19–32)
Calcium: 9.5 mg/dL (ref 8.4–10.5)
Chloride: 106 mEq/L (ref 96–112)
Creatinine, Ser: 1.28 mg/dL — ABNORMAL HIGH (ref 0.40–1.20)
GFR: 41.58 mL/min — ABNORMAL LOW (ref >60.00)
Glucose, Bld: 89 mg/dL (ref 70–99)
Potassium: 4.3 mEq/L (ref 3.5–5.1)
Sodium: 142 mEq/L (ref 135–145)
Total Bilirubin: 0.9 mg/dL (ref 0.2–1.2)
Total Protein: 6.8 g/dL (ref 6.0–8.3)

## 2022-04-20 NOTE — Telephone Encounter (Signed)
Pt called stating that the hydrocortizone suppositories that she was prescribed were $178 and she would like to look into other options in order to keep the cost down due to ins. not covering cost.

## 2022-04-21 ENCOUNTER — Other Ambulatory Visit: Payer: Self-pay | Admitting: Family Medicine

## 2022-04-21 LAB — ANTI-NUCLEAR AB-TITER (ANA TITER): ANA Titer 1: 1:40 {titer} — ABNORMAL HIGH

## 2022-04-21 LAB — ANA: Anti Nuclear Antibody (ANA): POSITIVE — AB

## 2022-04-21 LAB — RHEUMATOID FACTOR: Rheumatoid fact SerPl-aCnc: <14 IU/mL (ref <14)

## 2022-04-21 MED ORDER — HYDROCORTISONE (PERIANAL) 2.5 % EX CREA: 1.0000 | TOPICAL_CREAM | Freq: Two times a day (BID) | CUTANEOUS | 0 refills | Status: DC

## 2022-04-22 NOTE — Progress Notes (Signed)
Established Patient Office Visit  Subjective   Patient ID: Brianna Farrell, female    DOB: [birth year]  Age: 71 y.o. MRN: [id number]  Chief Complaint  Patient presents with   Hemorrhoids    X2-3 weeks, pt states no pain, bleeding or discharge    HPI Pt here c/o hemorrhoids x 2-3 weeks that will not stay gone.  No pain or bleeding --- otc not working   she also c/o myalgia and joint pains.   Patient Active Problem List   Diagnosis Date Noted   Non-restorative sleep 12/16/2021   Chronic intermittent hypoxia with obstructive sleep apnea 08/27/2021   OSA (obstructive sleep apnea) 08/27/2021   Gastroesophageal reflux disease 06/01/2021   Snoring 06/01/2021   Lower extremity edema 06/01/2021   Elevated BP without diagnosis of hypertension 06/01/2021   MORBID OBESITY 06/14/2010   Hyperlipidemia LDL goal <100 04/20/2010   ANEMIA 04/20/2010   ARTHRITIS 04/20/2010   PALPITATIONS 04/20/2010   Past Medical History:  Diagnosis Date   Anemia    Arthritis    Cataract    Chicken pox    GERD (gastroesophageal reflux disease)    History of stomach ulcers    HTN (hypertension)    Hyperlipidemia    IBS (irritable bowel syndrome)    Macular degeneration    Migraines    Pneumonia    Urine incontinence    Past Surgical History:  Procedure Laterality Date   ABDOMINAL HYSTERECTOMY     Still has Ovaries   APPENDECTOMY  10/25/1983   DILATION AND CURETTAGE OF UTERUS  10/24/1982   EYE SURGERY     SHOULDER SURGERY Right    Shoulder Manipulation   TONSILLECTOMY AND ADENOIDECTOMY  10/25/1951   Social History   Tobacco Use   Smoking status: Never   Smokeless tobacco: Never  Substance Use Topics   Alcohol use: No   Drug use: No   Social History   Socioeconomic History   Marital status: Widowed    Spouse name: Not on file   Number of children: 1   Years of education: Not on file   Highest education level: Not on file  Occupational History   Occupation: retired  Tobacco Use    Smoking status: Never   Smokeless tobacco: Never  Substance and Sexual Activity   Alcohol use: No   Drug use: No   Sexual activity: Not on file  Other Topics Concern   Not on file  Social History Narrative   Lives alone   Right handed   Caffeine: 20 oz of dr. Malachi Bonds, no coffee   Social Determinants of Health   Financial Resource Strain: Low Risk  (04/18/2022)   Overall Financial Resource Strain (CARDIA)    Difficulty of Paying Living Expenses: Not hard at all  Food Insecurity: No Food Insecurity (04/18/2022)   Hunger Vital Sign    Worried About Running Out of Food in the Last Year: Never true    [place] in the Last Year: Never true  Transportation Needs: No Transportation Needs (04/18/2022)   PRAPARE - Hydrologist (Medical): No    Lack of Transportation (Non-Medical): No  Physical Activity: Inactive (04/18/2022)   Exercise Vital Sign    Days of Exercise per Week: 0 days    Minutes of Exercise per Session: 0 min  Stress: No Stress Concern Present (04/18/2022)   [place]    Feeling of Stress : Not  at all  Social Connections: Moderately Isolated (04/15/2021)   Social Connection and Isolation Panel [NHANES]    Frequency of Communication with Friends and Family: More than three times a week    Frequency of Social Gatherings with Friends and Family: More than three times a week    Attends Religious Services: More than 4 times per year    Active Member of [employer] or Organizations: No    Attends Archivist Meetings: Never    Marital Status: Widowed  Intimate Partner Violence: Not At Risk (04/18/2022)   Humiliation, Afraid, Rape, and Kick questionnaire    Fear of Current or Ex-Partner: No    Emotionally Abused: No    Physically Abused: No    Sexually Abused: No   Family Status  Relation Name Status   Mother  Deceased   Father  Deceased   Brother  Alive   Brother  Alive    Mat Aunt  (Not Specified)   Ethlyn Daniels  (Not Specified)   Ethlyn Daniels  (Not Specified)   Cousin  (Not Specified)   Family History  Problem Relation Age of Onset   Arthritis Mother    Osteoarthritis Mother    Rheum arthritis Mother    Kidney disease Mother    Hyperlipidemia Father    Heart disease Father    Hyperlipidemia Brother    Heart disease Brother    Breast cancer Maternal Aunt    Stroke Paternal Aunt    Breast cancer Paternal Aunt    Ovarian cancer Cousin        x 2   Allergies  Allergen Reactions   Ibuprofen Anaphylaxis    REACTION: hives, tongue edema   Nsaids Anaphylaxis    REACTION: hives, tongue edema      Review of Systems  Constitutional:  Negative for fever and malaise/fatigue.  HENT:  Negative for congestion.   Eyes:  Negative for blurred vision.  Respiratory:  Negative for shortness of breath.   Cardiovascular:  Negative for chest pain, palpitations and leg swelling.  Gastrointestinal:  Negative for abdominal pain, blood in stool and nausea.  Genitourinary:  Negative for dysuria and frequency.  Musculoskeletal:  Positive for joint pain and myalgias. Negative for falls.  Skin:  Negative for rash.  Neurological:  Negative for dizziness, loss of consciousness and headaches.  Endo/Heme/Allergies:  Negative for environmental allergies.  Psychiatric/Behavioral:  Negative for depression. The patient is not nervous/anxious.       Objective:     BP 126/86 (BP Location: Right Arm, Patient Position: Sitting, Cuff Size: Normal)   Pulse 73   Temp (!) 97.4 F (36.3 C) (Oral)   Resp 18   Ht '5\' 3"'$  (1.6 m)   Wt 175 lb 3.2 oz (79.5 kg)   SpO2 98%   BMI 31.04 kg/m  BP Readings from Last 3 Encounters:  04/19/22 126/86  12/07/21 114/80  11/11/21 110/60   Wt Readings from Last 3 Encounters:  04/19/22 175 lb 3.2 oz (79.5 kg)  04/18/22 171 lb (77.6 kg)  12/07/21 171 lb (77.6 kg)   SpO2 Readings from Last 3 Encounters:  04/19/22 98%  12/07/21 98%   11/11/21 98%      Physical Exam Vitals and nursing note reviewed.  Genitourinary:    Rectum: Guaiac result negative. Tenderness and external hemorrhoid present. No mass or internal hemorrhoid.      Results for orders placed or performed in visit on 04/19/22  CBC with Differential/Platelet  Result Value Ref Range  WBC 6.9 4.0 - 10.5 K/uL   RBC 4.55 3.87 - 5.11 Mil/uL   Hemoglobin 13.7 12.0 - 15.0 g/dL   HCT 40.9 36.0 - 46.0 %   MCV 89.9 78.0 - 100.0 fl   MCHC 33.5 30.0 - 36.0 g/dL   RDW 12.8 11.5 - 15.5 %   Platelets 301.0 150.0 - 400.0 K/uL   Neutrophils Relative % 46.3 43.0 - 77.0 %   Lymphocytes Relative 41.9 12.0 - 46.0 %   Monocytes Relative 7.6 3.0 - 12.0 %   Eosinophils Relative 3.8 0.0 - 5.0 %   Basophils Relative 0.4 0.0 - 3.0 %   Neutro Abs 3.2 1.4 - 7.7 K/uL   Lymphs Abs 2.9 0.7 - 4.0 K/uL   Monocytes Absolute 0.5 0.1 - 1.0 K/uL   Eosinophils Absolute 0.3 0.0 - 0.7 K/uL   Basophils Absolute 0.0 0.0 - 0.1 K/uL  Comprehensive metabolic panel  Result Value Ref Range   Sodium 142 135 - 145 mEq/L   Potassium 4.3 3.5 - 5.1 mEq/L   Chloride 106 96 - 112 mEq/L   CO2 29 19 - 32 mEq/L   Glucose, Bld 89 70 - 99 mg/dL   BUN 14 6 - 23 mg/dL   Creatinine, Ser 1.28 (H) 0.40 - 1.20 mg/dL   Total Bilirubin 0.9 0.2 - 1.2 mg/dL   Alkaline Phosphatase 59 39 - 117 U/L   AST 19 0 - 37 U/L   ALT 18 0 - 35 U/L   Total Protein 6.8 6.0 - 8.3 g/dL   Albumin 4.4 3.5 - 5.2 g/dL   GFR 41.58 (L) >60.00 mL/min   Calcium 9.5 8.4 - 10.5 mg/dL  Sedimentation rate  Result Value Ref Range   Sed Rate 13 0 - 30 mm/hr  Antinuclear Antib (ANA)  Result Value Ref Range   Anti Nuclear Antibody (ANA) POSITIVE (A) NEGATIVE  Rheumatoid Factor  Result Value Ref Range   Rhuematoid fact SerPl-aCnc <14 <14 IU/mL  Anti-nuclear ab-titer (ANA titer)  Result Value Ref Range   ANA Titer 1 1:40 (H) titer   ANA Pattern 1 Cytoplasmic (A)     Last CBC Lab Results  Component Value Date   WBC 6.9  04/19/2022   HGB 13.7 04/19/2022   HCT 40.9 04/19/2022   MCV 89.9 04/19/2022   RDW 12.8 04/19/2022   PLT 301.0 89/21/1941   Last metabolic panel Lab Results  Component Value Date   GLUCOSE 89 04/19/2022   NA 142 04/19/2022   K 4.3 04/19/2022   CL 106 04/19/2022   CO2 29 04/19/2022   BUN 14 04/19/2022   CREATININE 1.28 (H) 04/19/2022   CALCIUM 9.5 04/19/2022   PROT 6.8 04/19/2022   ALBUMIN 4.4 04/19/2022   BILITOT 0.9 04/19/2022   ALKPHOS 59 04/19/2022   AST 19 04/19/2022   ALT 18 04/19/2022   Last lipids Lab Results  Component Value Date   CHOL 185 12/07/2021   HDL 43.50 12/07/2021   LDLCALC 113 (H) 12/07/2021   TRIG 145.0 12/07/2021   CHOLHDL 4 12/07/2021   Last hemoglobin A1c No results found for: "HGBA1C" Last thyroid functions Lab Results  Component Value Date   TSH 2.67 05/31/2021   Last vitamin D No results found for: "25OHVITD2", "25OHVITD3", "VD25OH" Last vitamin B12 and Folate No results found for: "VITAMINB12", "FOLATE"    The 10-year ASCVD risk score (Arnett DK, et al., 2019) is: 12.7%    Assessment & Plan:   Problem List Items Addressed This Visit  None Visit Diagnoses     Hemorrhoids, unspecified hemorrhoid type    -  Primary   Relevant Medications   hydrocortisone (ANUSOL-HC) 25 MG suppository   Other Relevant Orders   Ambulatory referral to Gastroenterology   Pain in joint, multiple sites       Relevant Orders   CBC with Differential/Platelet (Completed)   Comprehensive metabolic panel (Completed)   Sedimentation rate (Completed)   Antinuclear Antib (ANA) (Completed)   Rheumatoid Factor (Completed)   Myalgia       Relevant Orders   CBC with Differential/Platelet (Completed)   Comprehensive metabolic panel (Completed)   Sedimentation rate (Completed)   Antinuclear Antib (ANA) (Completed)   Rheumatoid Factor (Completed)   Estrogen deficiency           Return if symptoms worsen or fail to improve.    Ann Held,  DO

## 2022-05-03 ENCOUNTER — Encounter: Payer: Self-pay | Admitting: Physician Assistant

## 2022-05-30 NOTE — Progress Notes (Unsigned)
05/31/2022 Quincie Haroon [id number] [birth year]  Referring provider: Carollee Herter, Alferd Apa, * Primary GI doctor: Dr. Tarri Glenn (Dr. Olevia Perches)  ASSESSMENT AND PLAN:   Internal hemorrhoids without complication Unknown if what patient was feeling was internal hemorrhoid versus skin tag, unremarkable rectal exam other than large internal hemorrhoids with large rectal vault and soft stool. Negative hemoccult.  No anemia.   -Sitz baths, increase fiber, increase water, need to fix toilet habits.  -Hydrocortisone external cream sent in, supp too expensive. -We discussed hemorrhoid banding here in the office if hemorrhoids do not improve with conservative treatment. - follow up for evaluation -     hydrocortisone (ANUSOL-HC) 2.5 % rectal cream; Place 1 Application rectally 2 (two) times daily.  Screen colon cancer 06/11/2014 colonoscopy for average screening good prep review prep, moderate diverticulosis sigmoid with associated angulation and hypertrophy, no complications no biopsies.   Recall 10 years.  Due 05/2024.  Patient Care Team: Carollee Herter, Alferd Apa, DO as PCP - General (Family Medicine) Marylynn Pearson, MD as Consulting Physician (Obstetrics and Gynecology) [place] PRESENT ILLNESS: 71 y.o. female with a past medical history of OSA, GERD, hyperlipidemia status post hysterectomy, and others listed below presents for evaluation of rectal bleeding.   06/11/2014 colonoscopy for average screening good prep review prep, moderate diverticulosis sigmoid with associated angulation and hypertrophy, no complications no biopsies.  Recall 10 years.  Due 05/2024.  She has history of IBS with intermittent diarrhea can be worse with stress, worse with artifical sugars.  States she has felt something on her rectum since the birth of her daughter. She states to help control pain from hemorrhoids, she has to clean really well and will push on her rectum to help clean this.  She felt something hard/firm in her rectum, has not felt for a week.  No rectal pain, no rectal bleeding. Denies itching/burning as long as she cleans after her BM well.  She will have dull ache in her rectum if she sits for a long time.  She has not done any creams.  Cousin had rectal cancer, no other family history.  No anemia 04/19/2022,   BUN 14, creatinine 1.28  CBC  04/19/2022  HGB 13.7 MCV 89.9  WBC 6.9 Platelets 301.0 Kidney function 04/19/2022  BUN 14 Cr 1.28  Potassium 4.3   LFTs 04/19/2022  AST 19 ALT 18 Alkphos 59 TBili 0.9 05/31/2021 TSH 2.67   Current Medications:        Current Outpatient Medications (Other):    hydrocortisone (ANUSOL-HC) 2.5 % rectal cream, Place 1 Application rectally 2 (two) times daily.   famotidine (PEPCID) 20 MG tablet, Take 1 tablet (20 mg total) by mouth 2 (two) times daily. (Patient not taking: Reported on 05/31/2022)  Medical History:  Past Medical History:  Diagnosis Date   Anemia    Arthritis    Cataract    Chicken pox    GERD (gastroesophageal reflux disease)    History of stomach ulcers    HTN (hypertension)    Hyperlipidemia    IBS (irritable bowel syndrome)    Macular degeneration    Migraines    Pneumonia    Urine incontinence    Allergies:  Allergies  Allergen Reactions   Ibuprofen Anaphylaxis    REACTION: hives, tongue edema   Nsaids Anaphylaxis    REACTION: hives, tongue edema     Surgical History:  She  has a past surgical history that includes Appendectomy (10/25/1983);  Tonsillectomy and adenoidectomy (10/25/1951); Abdominal hysterectomy; Dilation and curettage of uterus (10/24/1982); Shoulder surgery (Right); and Eye surgery. Family History:  Her family history includes Arthritis in her mother; Breast cancer in her maternal aunt and paternal aunt; Heart disease in her brother and father; Hyperlipidemia in her brother and father; Kidney disease in her mother; Osteoarthritis in her mother; Ovarian cancer in her  cousin; Rheum arthritis in her mother; Stroke in her paternal aunt; Uterine cancer in her maternal aunt. Social History:   reports that she has never smoked. She has never used smokeless tobacco. She reports that she does not drink alcohol and does not use drugs.  REVIEW OF SYSTEMS  : All other systems reviewed and negative except where noted in the History of Present Illness.   PHYSICAL EXAM: BP 132/79   Pulse 75   Ht '5\' 3"'$  (1.6 m)   Wt 175 lb (79.4 kg)   BMI 31.00 kg/m  General:   Pleasant, well developed female in no acute distress Head:   Normocephalic and atraumatic. Eyes:  sclerae anicteric,conjunctive pink  Heart:   regular rate and rhythm Pulm:  Clear anteriorly; no wheezing Abdomen:   Soft, Obese AB, Active bowel sounds. No tenderness . Without guarding and Without rebound, No organomegaly appreciated. Rectal: posterior skin tag, no fissures, normal rectal tone, large appreciated internal hemorrhoids, non-tender, no masses appreciated, brown stool soft in rectal vault, hemoccult Negative Extremities:  Without edema. Msk: Symmetrical without gross deformities. Peripheral pulses intact.  Neurologic:  Alert and  oriented x4;  No focal deficits.  Skin:   Dry and intact without significant lesions or rashes. Psychiatric:  Cooperative. Normal mood and affect.    Vladimir Crofts, PA-C 3:02 PM

## 2022-05-31 ENCOUNTER — Encounter: Payer: Self-pay | Admitting: Physician Assistant

## 2022-05-31 ENCOUNTER — Ambulatory Visit (INDEPENDENT_AMBULATORY_CARE_PROVIDER_SITE_OTHER): Payer: Medicare Other | Admitting: Physician Assistant

## 2022-05-31 VITALS — BP 132/79 | HR 75 | Ht 63.0 in | Wt 175.0 lb

## 2022-05-31 DIAGNOSIS — Z1211 Encounter for screening for malignant neoplasm of colon: Secondary | ICD-10-CM

## 2022-05-31 DIAGNOSIS — K648 Other hemorrhoids: Secondary | ICD-10-CM | POA: Diagnosis not present

## 2022-05-31 MED ORDER — HYDROCORTISONE (PERIANAL) 2.5 % EX CREA: 1.0000 | TOPICAL_CREAM | Freq: Two times a day (BID) | CUTANEOUS | 2 refills | Status: DC

## 2022-05-31 NOTE — Progress Notes (Signed)
Reviewed and agree with management plans. ? ?Kris No L. Karter Hellmer, MD, MPH  ?

## 2022-05-31 NOTE — Patient Instructions (Addendum)
_______________________________________________________  If you are age 73 or older, your body mass index should be between 23-30. Your Body mass index is 31 kg/m. If this is out of the aforementioned range listed, please consider follow up with your Primary Care Provider.  If you are age 63 or younger, your body mass index should be between 19-25. Your Body mass index is 31 kg/m. If this is out of the aformentioned range listed, please consider follow up with your Primary Care Provider.   ________________________________________________________  The [facility] providers would like to encourage you to use [facility] to communicate with providers for non-urgent requests or questions.  Due to long hold times on the telephone, sending your provider a message by [facility] may be a faster and more efficient way to get a response.  Please allow 48 business hours for a response.  Please remember that this is for non-urgent requests.  _______________________________________________________   We have sent the following medications to your pharmacy for you to pick up at your convenience: Anusol cream  Please do sitz baths- these can be found at the pharmacy. It is a English as a second language teacher that is put in your toliet.  Please increase fiber or add benefiber, increase water and increase acitivity.  Please get squatty potty or stool to get your knees above your hips when you have a bowel movement.   Apply a pea size amount of over the counter Anusol HC cream to the tip of an over the counter PrepH suppository and insert rectally once every night for at least 7 nights.  If this does not improve there are procedures that can be done.   About Hemorrhoids  Hemorrhoids are swollen veins in the lower rectum and anus.  Also called piles, hemorrhoids are a common problem.  Hemorrhoids may be internal (inside the rectum) or external (around the anus).  Internal Hemorrhoids  Internal hemorrhoids are often painless, but  they rarely cause bleeding.  The internal veins may stretch and fall down (prolapse) through the anus to the outside of the body.  The veins may then become irritated and painful.  External Hemorrhoids  External hemorrhoids can be easily seen or felt around the anal opening.  They are under the skin around the anus.  When the swollen veins are scratched or broken by straining, rubbing or wiping they sometimes bleed.  How Hemorrhoids Occur  Veins in the rectum and around the anus tend to swell under pressure.  Hemorrhoids can result from increased pressure in the veins of your anus or rectum.  Some sources of pressure are:  Straining to have a bowel movement because of constipation Waiting too long to have a bowel movement Coughing and sneezing often Sitting for extended periods of time, including on the toilet Diarrhea Obesity Trauma or injury to the anus Some liver diseases Stress Family history of hemorrhoids Pregnancy  Pregnant women should try to avoid becoming constipated, because they are more likely to have hemorrhoids during pregnancy.  In the last trimester of pregnancy, the enlarged uterus may press on blood vessels and causes hemorrhoids.  In addition, the strain of childbirth sometimes causes hemorrhoids after the birth.  Symptoms of Hemorrhoids  Some symptoms of hemorrhoids include: Swelling and/or a tender lump around the anus Itching, mild burning and bleeding around the anus Painful bowel movements with or without constipation Bright red blood covering the stool, on toilet paper or in the toilet bowel.   Symptoms usually go away within a few days.  Always talk  to your doctor about any bleeding to make sure it is not from some other causes.  Diagnosing and Treating Hemorrhoids  Diagnosis is made by an examination by your healthcare provider.  Special test can be performed by your doctor.    Most cases of hemorrhoids can be treated with: High-fiber diet: Eat more  high-fiber foods, which help prevent constipation.  Ask for more detailed fiber information on types and sources of fiber from your healthcare provider. Fluids: Drink plenty of water.  This helps soften bowel movements so they are easier to pass. Sitz baths and cold packs: Sitting in lukewarm water two or three times a day for 15 minutes cleases the anal area and may relieve discomfort.  If the water is too hot, swelling around the anus will get worse.  Placing a cloth-covered ice pack on the anus for ten minutes four times a day can also help reduce selling.  Gently pushing a prolapsed hemorrhoid back inside after the bath or ice pack can be helpful. Medications: For mild discomfort, your healthcare provider may suggest over-the-counter pain medication or prescribe a cream or ointment for topical use.  The cream may contain witch hazel, zinc oxide or petroleum jelly.  Medicated suppositories are also a treatment option.  Always consult your doctor before applying medications or creams. Procedures and surgeries: There are also a number of procedures and surgeries to shrink or remove hemorrhoids in more serious cases.  Talk to your physician about these options.  You can often prevent hemorrhoids or keep them from becoming worse by maintaining a healthy lifestyle.  Eat a fiber-rich diet of fruits, vegetables and whole grains.  Also, drink plenty of water and exercise regularly.   2007, [employer] Doc.37  Thank you for entrusting me with your care and choosing [facility].  Vicie Mutters, PA

## 2022-07-26 ENCOUNTER — Encounter: Payer: Self-pay | Admitting: Gastroenterology

## 2022-07-26 ENCOUNTER — Ambulatory Visit (INDEPENDENT_AMBULATORY_CARE_PROVIDER_SITE_OTHER): Payer: Medicare Other | Admitting: Gastroenterology

## 2022-07-26 VITALS — BP 120/80 | HR 91 | Ht 63.0 in | Wt 174.0 lb

## 2022-07-26 DIAGNOSIS — K648 Other hemorrhoids: Secondary | ICD-10-CM

## 2022-07-26 DIAGNOSIS — R1319 Other dysphagia: Secondary | ICD-10-CM

## 2022-07-26 NOTE — Progress Notes (Signed)
Referring Provider: Carollee Herter, Alferd Apa, * Primary Care Physician:  Carollee Herter, Alferd Apa, DO  Chief complaint:  Hemorrhoids   IMPRESSION:  Internal hemorrhoids with associated rectal fullness. Colonoscopy strongly encouraged, however, she feels like her symptoms have improved and that colonoscopy is not needed at this time. The risk would be delayed diagnosis of a polyp or mass.   Intermittent solid food dysphagia. Does not feel that her symptoms need additional evaluation or treatment. Continue famotidine.   PLAN: - Colonoscopy in 2025, earlier with new or persistent symptoms, she may call to schedule at any time - EGD with possible dilation when she would like to proceed - Follow-up PRN   HPI: Brianna Farrell is a 71 y.o. female who returns in follow-up. She was last seen by Vicie Mutters, PA-C 05/31/22.   Diagnosed with bulky internal hemorrhoids when she presented with rectal fullness. Symptoms first developed after child birth, but, has been more worrisome recently. Since that time, she has had difficulty with perineal cleaning. She will have a dull ache in her rectum if she sits for too long. Pain has improved with Anusol HC suppositories, although she did not use many because of associated discomfort.   Some intermittent dysphagia to meat and potatoes for many years. She will reposition to pass the food. No odynophagia or reflux. No neck pain, sore throat, dysphonia, weight loss, or blood in the stool.  Colonoscopy 06/11/2014 with Dr. Olevia Perches showed moderate diverticulosis with associated angulation and hypertrophy. Surveillance due in 05/2024.   Prior abdominal imaging: Ultrasound 05/06/2014: fatty liver, three small liver cysts, small renal cyst  Labs 04/19/22 showed normal  CBC and CMP except creatinine 1.28 TSH normal 05/31/21    Past Medical History:  Diagnosis Date   Anemia    Arthritis    Cataract    Chicken pox    GERD (gastroesophageal reflux disease)    History of  stomach ulcers    HTN (hypertension)    Hyperlipidemia    IBS (irritable bowel syndrome)    Macular degeneration    Migraines    Pneumonia    Urine incontinence     Past Surgical History:  Procedure Laterality Date   ABDOMINAL HYSTERECTOMY     Still has Ovaries   APPENDECTOMY  10/25/1983   DILATION AND CURETTAGE OF UTERUS  10/24/1982   EYE SURGERY     SHOULDER SURGERY Right    Shoulder Manipulation   TONSILLECTOMY AND ADENOIDECTOMY  10/25/1951      Current Outpatient Medications  Medication Sig Dispense Refill   famotidine (PEPCID) 20 MG tablet Take 1 tablet (20 mg total) by mouth 2 (two) times daily. 60 tablet 5   hydrocortisone (ANUSOL-HC) 2.5 % rectal cream Place 1 Application rectally 2 (two) times daily. 30 g 2   No current facility-administered medications for this visit.    Allergies as of 07/26/2022 - Review Complete 07/26/2022  Allergen Reaction Noted   Ibuprofen Anaphylaxis    Nsaids Anaphylaxis     Family History  Problem Relation Age of Onset   Arthritis Mother    Osteoarthritis Mother    Rheum arthritis Mother    Kidney disease Mother    Hyperlipidemia Father    Heart disease Father    Hyperlipidemia Brother    Heart disease Brother    Breast cancer Maternal Aunt    Uterine cancer Maternal Aunt    Stroke Paternal Aunt    Breast cancer Paternal Aunt    Ovarian cancer Cousin  x 2   Stomach cancer Neg Hx     Social History   Socioeconomic History   Marital status: Widowed    Spouse name: Not on file   Number of children: 1   Years of education: Not on file   Highest education level: Not on file  Occupational History   Occupation: retired   Occupation: retired  Tobacco Use   Smoking status: Never   Smokeless tobacco: Never  Scientific laboratory technician Use: Never used  Substance and Sexual Activity   Alcohol use: No   Drug use: No   Sexual activity: Not on file  Other Topics Concern   Not on file  Social History Narrative   Lives  alone   Right handed   Caffeine: 20 oz of dr. Malachi Bonds, no coffee   Social Determinants of Health   Financial Resource Strain: Low Risk  (04/18/2022)   Overall Financial Resource Strain (CARDIA)    Difficulty of Paying Living Expenses: Not hard at all  Food Insecurity: No Food Insecurity (04/18/2022)   Hunger Vital Sign    Worried About Running Out of Food in the Last Year: Never true    [place] in the Last Year: Never true  Transportation Needs: No Transportation Needs (04/18/2022)   PRAPARE - Hydrologist (Medical): No    Lack of Transportation (Non-Medical): No  Physical Activity: Inactive (04/18/2022)   Exercise Vital Sign    Days of Exercise per Week: 0 days    Minutes of Exercise per Session: 0 min  Stress: No Stress Concern Present (04/18/2022)   [place]    Feeling of Stress : Not at all  Social Connections: Moderately Isolated (04/15/2021)   Social Connection and Isolation Panel [NHANES]    Frequency of Communication with Friends and Family: More than three times a week    Frequency of Social Gatherings with Friends and Family: More than three times a week    Attends Religious Services: More than 4 times per year    Active Member of [employer] or Organizations: No    Attends Archivist Meetings: Never    Marital Status: Widowed  Intimate Partner Violence: Not At Risk (04/18/2022)   Humiliation, Afraid, Rape, and Kick questionnaire    Fear of Current or Ex-Partner: No    Emotionally Abused: No    Physically Abused: No    Sexually Abused: No    Review of Systems: 12 system ROS is negative except as noted above.   Physical Exam: General:   Alert,  well-nourished, pleasant and cooperative in NAD Head:  Normocephalic and atraumatic. Eyes:  Sclera clear, no icterus.   Conjunctiva pink. Ears:  Normal auditory acuity. Nose:  No deformity, discharge,  or lesions. Mouth:   No deformity or lesions.   Neck:  Supple; no masses or thyromegaly. Lungs:  Clear throughout to auscultation.   No wheezes. Heart:  Regular rate and rhythm; no murmurs. Abdomen:  Soft, nontender, nondistended, normal bowel sounds, no rebound or guarding. No hepatosplenomegaly.   Rectal:  Deferred  Msk:  Symmetrical. No boney deformities LAD: No inguinal or umbilical LAD Extremities:  No clubbing or edema. Neurologic:  Alert and  oriented x4;  grossly nonfocal Skin:  Intact without significant lesions or rashes. Psych:  Alert and cooperative. Normal mood and affect.   I spent 45  minutes, including in depth chart review, independent review of results,  communicating results with the patient directly, face-to-face time with the patient, coordinating care, ordering studies and medications as appropriate, and documentation.    Jordynne Mccown L. Tarri Glenn, MD, MPH 07/26/2022, 9:45 PM

## 2022-07-26 NOTE — Patient Instructions (Signed)
Follow up as needed.   Colonoscopy due in 2025.

## 2022-08-05 ENCOUNTER — Telehealth: Payer: Medicare Other | Admitting: Family

## 2022-08-05 DIAGNOSIS — U071 COVID-19: Secondary | ICD-10-CM

## 2022-08-05 MED ORDER — BENZONATATE 100 MG PO CAPS: 100.0000 mg | ORAL_CAPSULE | Freq: Three times a day (TID) | ORAL | 0 refills | Status: DC | PRN

## 2022-08-05 MED ORDER — MOLNUPIRAVIR EUA 200MG CAPSULE: 4.0000 | ORAL_CAPSULE | Freq: Two times a day (BID) | ORAL | 0 refills | Status: AC

## 2022-08-05 NOTE — Progress Notes (Signed)
Brianna Farrell is a 71 y.o. female with the following history as recorded in Epic[facility]:  Patient Active Problem List   Diagnosis Date Noted   Non-restorative sleep 12/16/2021   Chronic intermittent hypoxia with obstructive sleep apnea 08/27/2021   OSA (obstructive sleep apnea) 08/27/2021   Gastroesophageal reflux disease 06/01/2021   Snoring 06/01/2021   Lower extremity edema 06/01/2021   Elevated BP without diagnosis of hypertension 06/01/2021   MORBID OBESITY 06/14/2010   Hyperlipidemia LDL goal <100 04/20/2010   ANEMIA 04/20/2010   ARTHRITIS 04/20/2010   PALPITATIONS 04/20/2010    Current Outpatient Medications  Medication Sig Dispense Refill   famotidine (PEPCID) 20 MG tablet Take 1 tablet (20 mg total) by mouth 2 (two) times daily. 60 tablet 5   hydrocortisone (ANUSOL-HC) 2.5 % rectal cream Place 1 Application rectally 2 (two) times daily. 30 g 2   No current facility-administered medications for this visit.    Allergies: Ibuprofen and Nsaids  Past Medical History:  Diagnosis Date   Anemia    Arthritis    Cataract    Chicken pox    GERD (gastroesophageal reflux disease)    History of stomach ulcers    HTN (hypertension)    Hyperlipidemia    IBS (irritable bowel syndrome)    Macular degeneration    Migraines    Pneumonia    Urine incontinence     Past Surgical History:  Procedure Laterality Date   ABDOMINAL HYSTERECTOMY     Still has Ovaries   APPENDECTOMY  10/25/1983   DILATION AND CURETTAGE OF UTERUS  10/24/1982   EYE SURGERY     SHOULDER SURGERY Right    Shoulder Manipulation   TONSILLECTOMY AND ADENOIDECTOMY  10/25/1951    Family History  Problem Relation Age of Onset   Arthritis Mother    Osteoarthritis Mother    Rheum arthritis Mother    Kidney disease Mother    Hyperlipidemia Father    Heart disease Father    Hyperlipidemia Brother    Heart disease Brother    Breast cancer Maternal Aunt    Uterine cancer Maternal Aunt    Stroke Paternal Aunt     Breast cancer Paternal Aunt    Ovarian cancer Cousin        x 2   Stomach cancer Neg Hx     Social History   Tobacco Use   Smoking status: Never   Smokeless tobacco: Never  Substance Use Topics   Alcohol use: No    Subjective:    I connected with Brianna Farrell on 08/05/22 at 11:00 AM EDT by a video enabled telemedicine application and verified that I am speaking with the correct person using two identifiers.   I discussed the limitations of evaluation and management by telemedicine and the availability of in person appointments. The patient expressed understanding and agreed to proceed. Provider in office/ patient is at home; provider and patient are only 2 people on video call.   Started Tuesday evening with sore throat/ headache/ body aches; temperature has been between 100-101; did take Mucinex but felt this made her feel worse; trying to eat oyster crackers and drink Ginger Ale; tested positive for COVID yesterday;     Objective:  There were no vitals filed for this visit.  Head: Normocephalic and atraumatic  Lungs: Respirations unlabored;  Neurologic: Alert and oriented; speech intact;   Assessment:  1. COVID-19     Plan:  Rx for Molnupiravir take as directed; Rx for [employer] 100  mg tid prn; symptomatic treatment discussed and stressed need to work on staying hydrated; strict ER precautions discussed for upcoming weekend;   Patient is at her home in [place] so virtual appointment cannot be charged;   No follow-ups on file.  No orders of the defined types were placed in this encounter.   Requested Prescriptions    No prescriptions requested or ordered in this encounter

## 2022-08-08 ENCOUNTER — Telehealth: Payer: Self-pay

## 2022-08-08 NOTE — Telephone Encounter (Signed)
Pt did a VV appointment on Friday with Mickel Baas and she got the antiviral and tessalon for her cough. Pt reports that her swallowing has gotten better and her temp has normalized.   Pt is reporting having diarrhea since last Tue (08/02/22) when her sx has started. She is having soft and runny diarrhea 4-5 times a day and wanted to know if she can take the imodium OTC to help with it.   She is not keeping much food down and I have informed her to try to eat as much as she can and get some electrolytes because she is going to loose a lot of fluids through the diarrhea.

## 2022-08-09 NOTE — Telephone Encounter (Signed)
I have called the pt back this morning to check up on how she is doing. Pt reports that she is feeling weak today, but better than yesterday. She did keep some soup down last night and I have reiterated that she needs to make sure she eats, and gets some electrolytes in her sytem so she can gain more energy. I have also informed her that she can take the Imodium PRN per Dr. Carollee Herter. Pt stated understanding and was thankful for the call.

## 2022-08-11 ENCOUNTER — Telehealth: Payer: Self-pay | Admitting: Family Medicine

## 2022-08-11 NOTE — Telephone Encounter (Signed)
Patient called and she reports feeling dizziness. She has covid and is being treated for it. She reports her other symptoms are better.  Advised, per Dr. Charlett Blake, that she may be dehydrated and to increase water intake. She can also take a gatorrade. She should continue to hydrate in the next 24 hours and call back if not better.  Patient verbalized understanding.  Rod Holler CMA

## 2022-08-17 NOTE — Telephone Encounter (Signed)
Error message

## 2022-08-25 ENCOUNTER — Telehealth: Payer: Self-pay | Admitting: Family Medicine

## 2022-08-25 NOTE — Telephone Encounter (Signed)
Pt is calling. Stated she had covid and wasn't able to use CPAP machine. States she is still not able to use CPAP. Pt is requesting a call-back from nurse

## 2022-08-25 NOTE — Telephone Encounter (Signed)
Called the pt back. She is still recovering from covid. She states the CPAP dries her out  and this was prior to Covid. She is scared to start using it right now cause she still having symptoms although she is testing negative. Patient has contacted [facility] to make them aware as well. Advised the pt once she is able to restart she should do that and reach out to [facility] to see if they can make adjustments to the humidifier to help with moisture. Pt verbalized understanding.

## 2022-11-10 NOTE — Progress Notes (Deleted)
PATIENT: Brianna Farrell DOB: [birth year]  REASON FOR VISIT: follow up HISTORY FROM: patient  No chief complaint on file.    HISTORY OF PRESENT ILLNESS:  11/10/22 ALL:  Brianna Farrell returns for follow up for OSA on CPAP.   ONO results did show concerns for needing O2, however, in lab titration study showed O2 was maintained with CPAP only.   11/11/2021 ALL: Brianna Farrell is a 72 y.o. female here today for follow up for OSA on CPAP.  She was seen in consult with Dr Brett Fairy 07/2021 for snoring and daytime sleepiness. HST 08/23/2021 showed OSA with AHI 22.1/hr with hypoxemia. AutoPAP ordered. She is doing well. She is tolerating CPAP. She feels that she is resting better and waking more refreshed. She had ONO two nights ago and awaiting results. She is using dreamwear FFM. She feels it fits most of the time but may need to be adjusted.     HISTORY: (copied from Dr Dohmeier's previous note)  Brianna Farrell is a 72 year- old Caucasian patient seen here as a referral  by PCP on 08/12/2021  Chief concern according to patient : " I can easily go to sleep, when ever not active and not stimulated. My late husband had sleep apnea and I never thought I would have it, but now I snore- my daughter told me and have woken up by my own snoring, but I am tired and my sleep is not refreshing, I wake with a dry mouth- I never was  a morning person".    Brianna Farrell  has a past medical history of Anemia, Arthritis, Chicken pox, GERD (gastroesophageal reflux disease), History of stomach ulcers, HTN (hypertension), Hyperlipidemia, IBS (irritable bowel syndrome), Macular degeneration, Migraines, Pneumonia, and Urine incontinence.   Sleep relevant medical history: Nocturia one time-, Tonsillectomy at age 73,  Cervical spine- congential fusion , cant sleep flat on my back.     Family medical /sleep history: 2 living brothers, no other family member on CPAP with OSA. Chronic migraine in  daughter with insomnia.     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 In his last years he was demented and wandering.  She had been working as Pharmacist, hospital, Engineer, petroleum, Museum/gallery conservator,  and lives in a household alone, one dog.  Tobacco [id number]  .  ETOH use none ,  Caffeine intake in form of Coffee( /) Soda( Dr Malachi Bonds 1-2 a day) Tea ( /) and no energy drinks. Regular exercise in form of walking, 30 minutes a day.    Sleep habits are as follows: The patient's dinner time is between 6-8 PM. The patient goes to bed at 10-12 PM , but she had some night up to 4 AM., averages 6 hours of sleep. Her dog will wake her to be let outside.    The preferred sleep position is in a recliner , and she tends to turn to the right side. with the support of a crumbled comforter instead of  pillows. Recliner sleeper due to back problems, hasn't slept in a bed in a long time, recliner in the den , not the bedroom.  Has GERD.  The TV is on a lot.  Plays games on the phone when she cant sleep Dreams are reportedly frequent.  9-10  AM is the usual rise time. The patient wakes up spontaneously hours before.   She reports not feeling refreshed  or restored in AM, with symptoms such as dry mouth, pressure morning headaches, sinus headaches and residual fatigue.  Naps are taken infrequently, lasting from 1 to 2 hours- and are more  refreshing than nocturnal sleep.    REVIEW OF SYSTEMS: Out of a complete 14 system review of symptoms, the patient complains only of the following symptoms, none and all other reviewed systems are negative.  ESS: 10  ALLERGIES: Allergies  Allergen Reactions   Ibuprofen Anaphylaxis    REACTION: hives, tongue edema   Nsaids Anaphylaxis    REACTION: hives, tongue edema    HOME MEDICATIONS: Outpatient Medications Prior to Visit  Medication Sig Dispense Refill   benzonatate (TESSALON) 100 MG capsule Take 1 capsule (100 mg total) by  mouth 3 (three) times daily as needed. 20 capsule 0   famotidine (PEPCID) 20 MG tablet Take 1 tablet (20 mg total) by mouth 2 (two) times daily. 60 tablet 5   hydrocortisone (ANUSOL-HC) 2.5 % rectal cream Place 1 Application rectally 2 (two) times daily. 30 g 2   No facility-administered medications prior to visit.    PAST MEDICAL HISTORY: Past Medical History:  Diagnosis Date   Anemia    Arthritis    Cataract    Chicken pox    GERD (gastroesophageal reflux disease)    History of stomach ulcers    HTN (hypertension)    Hyperlipidemia    IBS (irritable bowel syndrome)    Macular degeneration    Migraines    Pneumonia    Urine incontinence     PAST SURGICAL HISTORY: Past Surgical History:  Procedure Laterality Date   ABDOMINAL HYSTERECTOMY     Still has Ovaries   APPENDECTOMY  10/25/1983   DILATION AND CURETTAGE OF UTERUS  10/24/1982   EYE SURGERY     SHOULDER SURGERY Right    Shoulder Manipulation   TONSILLECTOMY AND ADENOIDECTOMY  10/25/1951    FAMILY HISTORY: Family History  Problem Relation Age of Onset   Arthritis Mother    Osteoarthritis Mother    Rheum arthritis Mother    Kidney disease Mother    Hyperlipidemia Father    Heart disease Father    Hyperlipidemia Brother    Heart disease Brother    Breast cancer Maternal Aunt    Uterine cancer Maternal Aunt    Stroke Paternal Aunt    Breast cancer Paternal Aunt    Ovarian cancer Cousin        x 2   Stomach cancer Neg Hx     SOCIAL HISTORY: Social History   Socioeconomic History   Marital status: Widowed    Spouse name: Not on file   Number of children: 1   Years of education: Not on file   Highest education level: Not on file  Occupational History   Occupation: retired   Occupation: retired  Tobacco Use   Smoking status: Never   Smokeless tobacco: Never  Scientific laboratory technician Use: Never used  Substance and Sexual Activity   Alcohol use: No   Drug use: No   Sexual activity: Not on file   Other Topics Concern   Not on file  Social History Narrative   Lives alone   Right handed   Caffeine: 20 oz of dr. Malachi Bonds, no coffee   Social Determinants of Health   Financial Resource Strain: Low Risk  (04/18/2022)   Overall Financial Resource Strain (CARDIA)    Difficulty of Paying Living Expenses: Not hard at all  Food  Insecurity: No Food Insecurity (04/18/2022)   Hunger Vital Sign    Worried About Running Out of Food in the Last Year: Never true    Ran Out of Food in the Last Year: Never true  Transportation Needs: No Transportation Needs (04/18/2022)   PRAPARE - Hydrologist (Medical): No    Lack of Transportation (Non-Medical): No  Physical Activity: Inactive (04/18/2022)   Exercise Vital Sign    Days of Exercise per Week: 0 days    Minutes of Exercise per Session: 0 min  Stress: No Stress Concern Present (04/18/2022)   [place]    Feeling of Stress : Not at all  Social Connections: Moderately Isolated (04/15/2021)   Social Connection and Isolation Panel [NHANES]    Frequency of Communication with Friends and Family: More than three times a week    Frequency of Social Gatherings with Friends and Family: More than three times a week    Attends Religious Services: More than 4 times per year    Active Member of [employer] or Organizations: No    Attends Archivist Meetings: Never    Marital Status: Widowed  Intimate Partner Violence: Not At Risk (04/18/2022)   Humiliation, Afraid, Rape, and Kick questionnaire    Fear of Current or Ex-Partner: No    Emotionally Abused: No    Physically Abused: No    Sexually Abused: No     PHYSICAL EXAM  There were no vitals filed for this visit.  There is no height or weight on file to calculate BMI.  Generalized: Well developed, in no acute distress  Cardiology: normal rate and rhythm, no murmur noted Respiratory: clear to  auscultation bilaterally  Neurological examination  Mentation: Alert oriented to time, place, history taking. Follows all commands speech and language fluent Cranial nerve II-XII: Pupils were equal round reactive to light. Extraocular movements were full, visual field were full  Motor: The motor testing reveals 5 over 5 strength of all 4 extremities. Good symmetric motor tone is noted throughout.  Gait and station: Gait is normal.    DIAGNOSTIC DATA (LABS, IMAGING, TESTING) - I reviewed patient records, labs, notes, testing and imaging myself where available.     09/27/2016    4:07 PM  MMSE - Mini Mental State Exam  Orientation to time 5  Orientation to Place 5  Registration 3  Attention/ Calculation 5  Recall 3  Language- name 2 objects 2  Language- repeat 1  Language- follow 3 step command 3  Language- read & follow direction 1  Write a sentence 1  Copy design 1  Total score 30     Lab Results  Component Value Date   WBC 6.9 04/19/2022   HGB 13.7 04/19/2022   HCT 40.9 04/19/2022   MCV 89.9 04/19/2022   PLT 301.0 04/19/2022      Component Value Date/Time   NA 142 04/19/2022 1633   K 4.3 04/19/2022 1633   CL 106 04/19/2022 1633   CO2 29 04/19/2022 1633   GLUCOSE 89 04/19/2022 1633   BUN 14 04/19/2022 1633   CREATININE 1.28 (H) 04/19/2022 1633   CALCIUM 9.5 04/19/2022 1633   PROT 6.8 04/19/2022 1633   ALBUMIN 4.4 04/19/2022 1633   AST 19 04/19/2022 1633   ALT 18 04/19/2022 1633   ALKPHOS 59 04/19/2022 1633   BILITOT 0.9 04/19/2022 1633   Lab Results  Component Value Date   CHOL  185 12/07/2021   HDL 43.50 12/07/2021   LDLCALC 113 (H) 12/07/2021   TRIG 145.0 12/07/2021   CHOLHDL 4 12/07/2021   No results found for: "HGBA1C" No results found for: "VITAMINB12" Lab Results  Component Value Date   TSH 2.67 05/31/2021     ASSESSMENT AND PLAN 72 y.o. year old female  has a past medical history of Anemia, Arthritis, Cataract, Chicken pox, GERD  (gastroesophageal reflux disease), History of stomach ulcers, HTN (hypertension), Hyperlipidemia, IBS (irritable bowel syndrome), Macular degeneration, Migraines, Pneumonia, and Urine incontinence. here with   No diagnosis found.    Takeyla Puglise is doing well on CPAP therapy. Compliance report reveals excellent compliance. She was encouraged to continue using CPAP nightly and for greater than 4 hours each night. I will look for ONO results and add O2 if needed. She is feeling much better and waking more refreshed. She will continue to monitor for leak. No significant leak noted on compliance report. Risks of untreated sleep apnea review and education materials provided. Healthy lifestyle habits encouraged. She will follow up in 1 year, sooner if needed. She verbalizes understanding and agreement with this plan.    No orders of the defined types were placed in this encounter.    No orders of the defined types were placed in this encounter.     Brianna Presto, FNP-C 11/10/2022, 9:29 AM [facility] [address], [place], [place] [address] [phone]

## 2022-11-10 NOTE — Patient Instructions (Incomplete)

## 2022-11-16 ENCOUNTER — Ambulatory Visit: Payer: Medicare Other | Admitting: Family Medicine

## 2023-02-06 ENCOUNTER — Ambulatory Visit: Payer: Medicare Other | Admitting: Family Medicine

## 2023-02-21 ENCOUNTER — Ambulatory Visit: Payer: Medicare Other | Admitting: Family Medicine

## 2023-02-27 ENCOUNTER — Ambulatory Visit: Payer: Medicare Other | Admitting: Family Medicine

## 2023-03-16 DIAGNOSIS — Z1231 Encounter for screening mammogram for malignant neoplasm of breast: Secondary | ICD-10-CM | POA: Diagnosis not present

## 2023-03-16 DIAGNOSIS — Z6834 Body mass index (BMI) 34.0-34.9, adult: Secondary | ICD-10-CM | POA: Diagnosis not present

## 2023-03-16 DIAGNOSIS — Z1151 Encounter for screening for human papillomavirus (HPV): Secondary | ICD-10-CM | POA: Diagnosis not present

## 2023-03-16 DIAGNOSIS — Z1272 Encounter for screening for malignant neoplasm of vagina: Secondary | ICD-10-CM | POA: Diagnosis not present

## 2023-03-16 DIAGNOSIS — Z124 Encounter for screening for malignant neoplasm of cervix: Secondary | ICD-10-CM | POA: Diagnosis not present

## 2023-03-16 LAB — HM PAP SMEAR: HM Pap smear: NEGATIVE

## 2023-04-04 DIAGNOSIS — H52229 Regular astigmatism, unspecified eye: Secondary | ICD-10-CM | POA: Diagnosis not present

## 2023-04-04 DIAGNOSIS — Z8669 Personal history of other diseases of the nervous system and sense organs: Secondary | ICD-10-CM | POA: Diagnosis not present

## 2023-04-04 DIAGNOSIS — H04123 Dry eye syndrome of bilateral lacrimal glands: Secondary | ICD-10-CM | POA: Diagnosis not present

## 2023-04-04 DIAGNOSIS — H26493 Other secondary cataract, bilateral: Secondary | ICD-10-CM | POA: Diagnosis not present

## 2023-04-04 DIAGNOSIS — H353131 Nonexudative age-related macular degeneration, bilateral, early dry stage: Secondary | ICD-10-CM | POA: Diagnosis not present

## 2023-04-04 DIAGNOSIS — H40059 Ocular hypertension, unspecified eye: Secondary | ICD-10-CM | POA: Diagnosis not present

## 2023-04-10 ENCOUNTER — Ambulatory Visit: Payer: Medicare Other | Admitting: Family Medicine

## 2023-04-21 ENCOUNTER — Ambulatory Visit (INDEPENDENT_AMBULATORY_CARE_PROVIDER_SITE_OTHER): Payer: Medicare Other | Admitting: *Deleted

## 2023-04-21 DIAGNOSIS — Z0000 Encounter for general adult medical examination without abnormal findings: Secondary | ICD-10-CM | POA: Diagnosis not present

## 2023-04-21 NOTE — Patient Instructions (Signed)
Brianna Farrell , Thank you for taking time to come for your Medicare Wellness Visit. I appreciate your ongoing commitment to your health goals. Please review the following plan we discussed and let me know if I can assist you in the future.   These are the goals we discussed:  Goals      Maintain current health.        This is a list of the screening recommended for you and due dates:  Health Maintenance  Topic Date Due   COVID-19 Vaccine (1) Never done   DEXA scan (bone density measurement)  01/26/2019   Mammogram  11/02/2022   Flu Shot  05/25/2023   Medicare Annual Wellness Visit  04/20/2024   Colon Cancer Screening  06/11/2024   DTaP/Tdap/Td vaccine (2 - Td or Tdap) 02/05/2025   Pneumonia Vaccine  Completed   Hepatitis C Screening  Completed   Zoster (Shingles) Vaccine  Completed   HPV Vaccine  Aged Out     Next appointment: Follow up in one year for your annual wellness visit.   Preventive Care 7 Years and Older, Female Preventive care refers to lifestyle choices and visits with your health care provider that can promote health and wellness. What does preventive care include? A yearly physical exam. This is also called an annual well check. Dental exams once or twice a year. Routine eye exams. Ask your health care provider how often you should have your eyes checked. Personal lifestyle choices, including: Daily care of your teeth and gums. Regular physical activity. Eating a healthy diet. Avoiding tobacco and drug use. Limiting alcohol use. Practicing safe sex. Taking low-dose aspirin every day. Taking vitamin and mineral supplements as recommended by your health care provider. What happens during an annual well check? The services and screenings done by your health care provider during your annual well check will depend on your age, overall health, lifestyle risk factors, and family history of disease. Counseling  Your health care provider may ask you questions  about your: Alcohol use. Tobacco use. Drug use. Emotional well-being. Home and relationship well-being. Sexual activity. Eating habits. History of falls. Memory and ability to understand (cognition). Work and work Astronomer. Reproductive health. Screening  You may have the following tests or measurements: Height, weight, and BMI. Blood pressure. Lipid and cholesterol levels. These may be checked every 5 years, or more frequently if you are over 18 years old. Skin check. Lung cancer screening. You may have this screening every year starting at age 8 if you have a 30-pack-year history of smoking and currently smoke or have quit within the past 15 years. Fecal occult blood test (FOBT) of the stool. You may have this test every year starting at age 51. Flexible sigmoidoscopy or colonoscopy. You may have a sigmoidoscopy every 5 years or a colonoscopy every 10 years starting at age 25. Hepatitis C blood test. Hepatitis B blood test. Sexually transmitted disease (STD) testing. Diabetes screening. This is done by checking your blood sugar (glucose) after you have not eaten for a while (fasting). You may have this done every 1-3 years. Bone density scan. This is done to screen for osteoporosis. You may have this done starting at age 53. Mammogram. This may be done every 1-2 years. Talk to your health care provider about how often you should have regular mammograms. Talk with your health care provider about your test results, treatment options, and if necessary, the need for more tests. Vaccines  Your health care provider may  recommend certain vaccines, such as: Influenza vaccine. This is recommended every year. Tetanus, diphtheria, and acellular pertussis (Tdap, Td) vaccine. You may need a Td booster every 10 years. Zoster vaccine. You may need this after age 51. Pneumococcal 13-valent conjugate (PCV13) vaccine. One dose is recommended after age 63. Pneumococcal polysaccharide (PPSV23)  vaccine. One dose is recommended after age 92. Talk to your health care provider about which screenings and vaccines you need and how often you need them. This information is not intended to replace advice given to you by your health care provider. Make sure you discuss any questions you have with your health care provider. Document Released: 11/06/2015 Document Revised: 06/29/2016 Document Reviewed: 08/11/2015 Elsevier Interactive Patient Education  2017 [place] Prevention in the Home Falls can cause injuries. They can happen to people of all ages. There are many things you can do to make your home safe and to help prevent falls. What can I do on the outside of my home? Regularly fix the edges of walkways and driveways and fix any cracks. Remove anything that might make you trip as you walk through a door, such as a raised step or threshold. Trim any bushes or trees on the path to your home. Use bright outdoor lighting. Clear any walking paths of anything that might make someone trip, such as rocks or tools. Regularly check to see if handrails are loose or broken. Make sure that both sides of any steps have handrails. Any raised decks and porches should have guardrails on the edges. Have any leaves, snow, or ice cleared regularly. Use sand or salt on walking paths during winter. Clean up any spills in your garage right away. This includes oil or grease spills. What can I do in the bathroom? Use night lights. Install grab bars by the toilet and in the tub and shower. Do not use towel bars as grab bars. Use non-skid mats or decals in the tub or shower. If you need to sit down in the shower, use a plastic, non-slip stool. Keep the floor dry. Clean up any water that spills on the floor as soon as it happens. Remove soap buildup in the tub or shower regularly. Attach bath mats securely with double-sided non-slip rug tape. Do not have throw rugs and other things on the floor that  can make you trip. What can I do in the bedroom? Use night lights. Make sure that you have a light by your bed that is easy to reach. Do not use any sheets or blankets that are too big for your bed. They should not hang down onto the floor. Have a firm chair that has side arms. You can use this for support while you get dressed. Do not have throw rugs and other things on the floor that can make you trip. What can I do in the kitchen? Clean up any spills right away. Avoid walking on wet floors. Keep items that you use a lot in easy-to-reach places. If you need to reach something above you, use a strong step stool that has a grab bar. Keep electrical cords out of the way. Do not use floor polish or wax that makes floors slippery. If you must use wax, use non-skid floor wax. Do not have throw rugs and other things on the floor that can make you trip. What can I do with my stairs? Do not leave any items on the stairs. Make sure that there are handrails on both sides of  the stairs and use them. Fix handrails that are broken or loose. Make sure that handrails are as long as the stairways. Check any carpeting to make sure that it is firmly attached to the stairs. Fix any carpet that is loose or worn. Avoid having throw rugs at the top or bottom of the stairs. If you do have throw rugs, attach them to the floor with carpet tape. Make sure that you have a light switch at the top of the stairs and the bottom of the stairs. If you do not have them, ask someone to add them for you. What else can I do to help prevent falls? Wear shoes that: Do not have high heels. Have rubber bottoms. Are comfortable and fit you well. Are closed at the toe. Do not wear sandals. If you use a stepladder: Make sure that it is fully opened. Do not climb a closed stepladder. Make sure that both sides of the stepladder are locked into place. Ask someone to hold it for you, if possible. Clearly mark and make sure that you  can see: Any grab bars or handrails. First and last steps. Where the edge of each step is. Use tools that help you move around (mobility aids) if they are needed. These include: Canes. Walkers. Scooters. Crutches. Turn on the lights when you go into a dark area. Replace any light bulbs as soon as they burn out. Set up your furniture so you have a clear path. Avoid moving your furniture around. If any of your floors are uneven, fix them. If there are any pets around you, be aware of where they are. Review your medicines with your doctor. Some medicines can make you feel dizzy. This can increase your chance of falling. Ask your doctor what other things that you can do to help prevent falls. This information is not intended to replace advice given to you by your health care provider. Make sure you discuss any questions you have with your health care provider. Document Released: 08/06/2009 Document Revised: 03/17/2016 Document Reviewed: 11/14/2014 Elsevier Interactive Patient Education  2017 [employer].

## 2023-04-21 NOTE — Progress Notes (Signed)
Subjective:   Brianna Farrell is a 72 y.o. female who presents for Medicare Annual (Subsequent) preventive examination.  Visit Complete: Virtual  I connected with  Alfredo Martinez on 04/21/23 by a audio enabled telemedicine application and verified that I am speaking with the correct person using two identifiers.  Patient Location: Home  Provider Location: Office/Clinic  I discussed the limitations of evaluation and management by telemedicine. The patient expressed understanding and agreed to proceed.   Review of Systems     Cardiac Risk Factors include: advanced age (>27men, >71 women);obesity (BMI >30kg/m2);dyslipidemia     Objective:    Today's Vitals   There is no height or weight on file to calculate BMI.     04/21/2023    3:14 PM 04/18/2022   12:31 PM 04/15/2021    3:48 PM 05/31/2019    2:14 PM 05/29/2018    2:17 PM 09/27/2016    4:01 PM  Advanced Directives  Does Patient Have a Medical Advance Directive? Yes Yes Yes No Yes Yes  Type of Estate agent of [place];Living will Healthcare Power of [place];Living will Healthcare Power of [place];Living will  Healthcare Power of [place];Living will Healthcare Power of [place];Living will  Does patient want to make changes to medical advance directive? No - Patient declined     No - Patient declined  Copy of Healthcare Power of Attorney in Chart? No - copy requested No - copy requested No - copy requested  No - copy requested No - copy requested  Would patient like information on creating a medical advance directive?    No - Patient declined      Current Medications (verified) Outpatient Encounter Medications as of 04/21/2023  Medication Sig   [DISCONTINUED] benzonatate (TESSALON) 100 MG capsule Take 1 capsule (100 mg total) by mouth 3 (three) times daily as needed.   [DISCONTINUED] famotidine (PEPCID) 20 MG tablet Take 1 tablet (20 mg total) by mouth 2 (two) times daily.   [DISCONTINUED] hydrocortisone  (ANUSOL-HC) 2.5 % rectal cream Place 1 Application rectally 2 (two) times daily.   No facility-administered encounter medications on file as of 04/21/2023.    Allergies (verified) Ibuprofen and Nsaids   History: Past Medical History:  Diagnosis Date   Anemia    Arthritis    Cataract    Chicken pox    GERD (gastroesophageal reflux disease)    History of stomach ulcers    HTN (hypertension)    Hyperlipidemia    IBS (irritable bowel syndrome)    Macular degeneration    Migraines    Pneumonia    Urine incontinence    Past Surgical History:  Procedure Laterality Date   ABDOMINAL HYSTERECTOMY     Still has Ovaries   APPENDECTOMY  10/25/1983   DILATION AND CURETTAGE OF UTERUS  10/24/1982   EYE SURGERY     SHOULDER SURGERY Right    Shoulder Manipulation   TONSILLECTOMY AND ADENOIDECTOMY  10/25/1951   Family History  Problem Relation Age of Onset   Arthritis Mother    Osteoarthritis Mother    Rheum arthritis Mother    Kidney disease Mother    Hyperlipidemia Father    Heart disease Father    Hyperlipidemia Brother    Heart disease Brother    Breast cancer Maternal Aunt    Uterine cancer Maternal Aunt    Stroke Paternal Aunt    Breast cancer Paternal Aunt    Ovarian cancer Cousin        x  2   Stomach cancer Neg Hx    Social History   Socioeconomic History   Marital status: Widowed    Spouse name: Not on file   Number of children: 1   Years of education: Not on file   Highest education level: Not on file  Occupational History   Occupation: retired   Occupation: retired  Tobacco Use   Smoking status: Never   Smokeless tobacco: Never  Building services engineer Use: Never used  Substance and Sexual Activity   Alcohol use: No   Drug use: No   Sexual activity: Not on file  Other Topics Concern   Not on file  Social History Narrative   Lives alone   Right handed   Caffeine: 20 oz of dr. Reino Kent, no coffee   Social Determinants of Health   Financial Resource  Strain: Low Risk  (04/21/2023)   Overall Financial Resource Strain (CARDIA)    Difficulty of Paying Living Expenses: Not very hard  Food Insecurity: No Food Insecurity (04/21/2023)   Hunger Vital Sign    Worried About Running Out of Food in the Last Year: Never true    Ran Out of Food in the Last Year: Never true  Transportation Needs: No Transportation Needs (04/21/2023)   PRAPARE - Administrator, Civil Service (Medical): No    Lack of Transportation (Non-Medical): No  Physical Activity: Inactive (04/21/2023)   Exercise Vital Sign    Days of Exercise per Week: 0 days    Minutes of Exercise per Session: 0 min  Stress: No Stress Concern Present (04/21/2023)   [employer] of Occupational Health - Occupational Stress Questionnaire    Feeling of Stress : Only a little  Social Connections: Moderately Integrated (04/21/2023)   Social Connection and Isolation Panel [NHANES]    Frequency of Communication with Friends and Family: More than three times a week    Frequency of Social Gatherings with Friends and Family: More than three times a week    Attends Religious Services: More than 4 times per year    Active Member of [employer] or Organizations: Yes    Attends Banker Meetings: More than 4 times per year    Marital Status: Widowed    Tobacco Counseling Counseling given: Not Answered   Clinical Intake:  Pre-visit preparation completed: Yes  Pain : No/denies pain  Nutritional Risks: None Diabetes: No  How often do you need to have someone help you when you read instructions, pamphlets, or other written materials from your doctor or pharmacy?: 1 - Never  Interpreter Needed?: No  Information entered by :: Donne Anon, CMA   Activities of Daily Living    04/21/2023    3:05 PM  In your present state of health, do you have any difficulty performing the following activities:  Hearing? 1  Comment does not wear hearing aids  Vision? 0  Difficulty  concentrating or making decisions? 0  Walking or climbing stairs? 0  Dressing or bathing? 0  Doing errands, shopping? 0  Preparing Food and eating ? N  Using the Toilet? N  In the past six months, have you accidently leaked urine? Y  Do you have problems with loss of bowel control? N  Managing your Medications? N  Managing your Finances? N  Housekeeping or managing your Housekeeping? N    Patient Care Team: Zola Button, Grayling Congress, DO as PCP - General (Family Medicine) Zelphia Cairo, MD as Consulting Physician (Obstetrics and  Gynecology) [facility]  Indicate any recent Medical Services you may have received from other than [facility] providers in the past year (date may be approximate).     Assessment:   This is a routine wellness examination for Vickki.  Hearing/Vision screen No results found.  Dietary issues and exercise activities discussed:     Goals Addressed   None    Depression Screen    04/21/2023    3:12 PM 04/18/2022   12:34 PM 05/31/2021    1:08 PM 04/15/2021    3:50 PM 05/31/2019    2:14 PM 05/29/2018    2:17 PM 05/29/2018    1:22 PM  PHQ 2/9 Scores  PHQ - 2 Score 0 0 0 0 0 0 0    Fall Risk    04/21/2023    3:03 PM 04/18/2022   12:32 PM 05/31/2021    1:08 PM 04/15/2021    3:50 PM 05/31/2019    2:14 PM  Fall Risk   Falls in the past year? 1 1 0 0 0  Comment rubber sole of shoe gripped the ground      Number falls in past yr: 0 0 0 0   Injury with Fall? 0 0 0 0   Risk for fall due to : No Fall Risks History of fall(s)     Follow up Falls evaluation completed Falls prevention discussed  Falls prevention discussed     MEDICARE RISK AT HOME:  Medicare Risk at Home - 04/21/23 1504     Any stairs in or around the home? Yes    If so, are there any without handrails? No    Home free of loose throw rugs in walkways, pet beds, electrical cords, etc? Yes    Adequate lighting in your home to reduce risk of falls? Yes    Life alert? No    Use of a cane,  walker or w/c? No    Grab bars in the bathroom? Yes    Shower chair or bench in shower? No    Elevated toilet seat or a handicapped toilet? Yes   comfort height            TIMED UP AND GO:  Was the test performed?  No    Cognitive Function:    09/27/2016    4:07 PM  MMSE - Mini Mental State Exam  Orientation to time 5  Orientation to Place 5  Registration 3  Attention/ Calculation 5  Recall 3  Language- name 2 objects 2  Language- repeat 1  Language- follow 3 step command 3  Language- read & follow direction 1  Write a sentence 1  Copy design 1  Total score 30        04/21/2023    3:15 PM  6CIT Screen  What Year? 0 points  What month? 0 points  What time? 0 points  Count back from 20 0 points  Months in reverse 0 points  Repeat phrase 0 points  Total Score 0 points    Immunizations Immunization History  Administered Date(s) Administered   Influenza, High Dose Seasonal PF 08/23/2016, 08/30/2017   Influenza-Unspecified 08/24/2014, 07/25/2015, 08/24/2018   Pneumococcal Conjugate-13 01/05/2015   Pneumococcal Polysaccharide-23 09/27/2016   Tdap 02/06/2015   Zoster Recombinat (Shingrix) 11/05/2018, 01/15/2019   Zoster, Live 09/30/2014    TDAP status: Up to date  Flu Vaccine status: Up to date  Pneumococcal vaccine status: Up to date  Covid-19 vaccine status: Information provided on  how to obtain vaccines.   Qualifies for Shingles Vaccine? Yes   Zostavax completed Yes   Shingrix Completed?: Yes  Screening Tests Health Maintenance  Topic Date Due   COVID-19 Vaccine (1) Never done   DEXA SCAN  01/26/2019   MAMMOGRAM  11/02/2022   INFLUENZA VACCINE  05/25/2023   Medicare Annual Wellness (AWV)  04/20/2024   Colonoscopy  06/11/2024   DTaP/Tdap/Td (2 - Td or Tdap) 02/05/2025   Pneumonia Vaccine 31+ Years old  Completed   Hepatitis C Screening  Completed   Zoster Vaccines- Shingrix  Completed   HPV VACCINES  Aged Out    Health  Maintenance  Health Maintenance Due  Topic Date Due   COVID-19 Vaccine (1) Never done   DEXA SCAN  01/26/2019   MAMMOGRAM  11/02/2022    Colorectal cancer screening: Type of screening: Colonoscopy. Completed 06/11/14. Repeat every 10 years  Mammogram status: Completed 11/02/21. Repeat every year  Bone Density status: Completed 11/26/21. Results reflect: Bone density results: NORMAL. Repeat every 2 years.  Lung Cancer Screening: (Low Dose CT Chest recommended if Age 69-80 years, 20 pack-year currently smoking OR have quit w/in 15years.) does not qualify.   Additional Screening:  Hepatitis C Screening: does qualify; Completed 07/16/15  Vision Screening: Recommended annual ophthalmology exams for early detection of glaucoma and other disorders of the eye. Is the patient up to date with their annual eye exam?  Yes  Who is the provider or what is the name of the office in which the patient attends annual eye exams? Dr. Ferrel Logan If pt is not established with a provider, would they like to be referred to a provider to establish care? No .   Dental Screening: Recommended annual dental exams for proper oral hygiene  Diabetic Foot Exam: N/a  Community Resource Referral / Chronic Care Management: CRR required this visit?  No   CCM required this visit?  No     Plan:     I have personally reviewed and noted the following in the patient's chart:   Medical and social history Use of alcohol, tobacco or illicit drugs  Current medications and supplements including opioid prescriptions. Patient is not currently taking opioid prescriptions. Functional ability and status Nutritional status Physical activity Advanced directives List of other physicians Hospitalizations, surgeries, and ER visits in previous 12 months Vitals Screenings to include cognitive, depression, and falls Referrals and appointments  In addition, I have reviewed and discussed with patient certain preventive  protocols, quality metrics, and best practice recommendations. A written personalized care plan for preventive services as well as general preventive health recommendations were provided to patient.     Donne Anon, CMA   04/21/2023   After Visit Summary: (MyChart) Due to this being a telephonic visit, the after visit summary with patients personalized plan was offered to patient via MyChart   Nurse Notes: None

## 2023-04-24 ENCOUNTER — Ambulatory Visit (INDEPENDENT_AMBULATORY_CARE_PROVIDER_SITE_OTHER): Payer: Medicare Other | Admitting: Family Medicine

## 2023-04-24 ENCOUNTER — Ambulatory Visit (HOSPITAL_BASED_OUTPATIENT_CLINIC_OR_DEPARTMENT_OTHER)
Admission: RE | Admit: 2023-04-24 | Discharge: 2023-04-24 | Disposition: A | Discharge status: Home / Self Care | Payer: Medicare Other | Source: Ambulatory Visit | Attending: Family Medicine | Admitting: Family Medicine

## 2023-04-24 ENCOUNTER — Encounter: Payer: Self-pay | Admitting: Family Medicine

## 2023-04-24 VITALS — BP 110/70 | HR 69 | Temp 98.3°F | Resp 16 | Ht 63.0 in | Wt 173.0 lb

## 2023-04-24 DIAGNOSIS — Z6830 Body mass index (BMI) 30.0-30.9, adult: Secondary | ICD-10-CM

## 2023-04-24 DIAGNOSIS — Z0000 Encounter for general adult medical examination without abnormal findings: Secondary | ICD-10-CM | POA: Insufficient documentation

## 2023-04-24 DIAGNOSIS — R030 Elevated blood-pressure reading, without diagnosis of hypertension: Secondary | ICD-10-CM

## 2023-04-24 DIAGNOSIS — R600 Localized edema: Secondary | ICD-10-CM

## 2023-04-24 DIAGNOSIS — R2231 Localized swelling, mass and lump, right upper limb: Secondary | ICD-10-CM | POA: Diagnosis not present

## 2023-04-24 DIAGNOSIS — E785 Hyperlipidemia, unspecified: Secondary | ICD-10-CM

## 2023-04-24 DIAGNOSIS — M799 Soft tissue disorder, unspecified: Secondary | ICD-10-CM | POA: Diagnosis not present

## 2023-04-24 DIAGNOSIS — Z1211 Encounter for screening for malignant neoplasm of colon: Secondary | ICD-10-CM | POA: Insufficient documentation

## 2023-04-24 NOTE — Progress Notes (Signed)
Established Patient Office Visit  Subjective   Patient ID: Brianna Farrell, female    DOB: Jul 13, 1951  Age: 72 y.o. MRN: [id number]  Chief Complaint  Patient presents with   Follow-up    HPI Discussed the use of AI scribe software for clinical note transcription with the patient, who gave verbal consent to proceed.  History of Present Illness   The patient, with a history of large internal hemorrhoids, presents for a routine check-up. She reports no current medications and no need for refills. She recently had a mammogram and bone density test, both of which were normal. She also had a Pap smear, which was normal. She reports a family history of Stage 3 kidney disease in her mother, who passed away at the age of 64 due to kidney failure caused by a prescription strength ibuprofen.  The patient has a small cyst on her right wrist, which does not cause any discomfort or pain. She also reports having had COVID-19 in October of the previous year. She has a history of arthritis and suspects that the cyst may be related to this condition. She also reports having had nine ticks removed and multiple spider bites.  The patient has regular check-ups with an eye doctor and a dentist. She also sees a gynecologist regularly. She has a history of hysterectomy but still has her ovaries. She has been told that she has large internal hemorrhoids, but these do not cause any discomfort.      Patient Active Problem List   Diagnosis Date Noted   Mass of right wrist 04/24/2023   Preventative health care 04/24/2023   Non-restorative sleep 12/16/2021   Chronic intermittent hypoxia with obstructive sleep apnea 08/27/2021   OSA (obstructive sleep apnea) 08/27/2021   Gastroesophageal reflux disease 06/01/2021   Snoring 06/01/2021   Lower extremity edema 06/01/2021   Elevated BP without diagnosis of hypertension 06/01/2021   MORBID OBESITY 06/14/2010   Hyperlipidemia LDL goal <100 04/20/2010   ANEMIA 04/20/2010    ARTHRITIS 04/20/2010   PALPITATIONS 04/20/2010   Past Medical History:  Diagnosis Date   Anemia    Arthritis    Cataract    Chicken pox    GERD (gastroesophageal reflux disease)    History of stomach ulcers    HTN (hypertension)    Hyperlipidemia    IBS (irritable bowel syndrome)    Macular degeneration    Migraines    Pneumonia    Urine incontinence    Past Surgical History:  Procedure Laterality Date   ABDOMINAL HYSTERECTOMY     Still has Ovaries   APPENDECTOMY  10/25/1983   DILATION AND CURETTAGE OF UTERUS  10/24/1982   EYE SURGERY     SHOULDER SURGERY Right    Shoulder Manipulation   TONSILLECTOMY AND ADENOIDECTOMY  10/25/1951   Social History   Tobacco Use   Smoking status: Never   Smokeless tobacco: Never  Vaping Use   Vaping Use: Never used  Substance Use Topics   Alcohol use: No   Drug use: No   Social History   Socioeconomic History   Marital status: Widowed    Spouse name: Not on file   Number of children: 1   Years of education: Not on file   Highest education level: Not on file  Occupational History   Occupation: retired   Occupation: retired  Tobacco Use   Smoking status: Never   Smokeless tobacco: Never  Vaping Use   Vaping Use: Never used  Substance and  Sexual Activity   Alcohol use: No   Drug use: No   Sexual activity: Not on file  Other Topics Concern   Not on file  Social History Narrative   Lives alone   Right handed   Caffeine: 20 oz of dr. Reino Kent, no coffee   Social Determinants of Health   Financial Resource Strain: Low Risk  (04/21/2023)   Overall Financial Resource Strain (CARDIA)    Difficulty of Paying Living Expenses: Not very hard  Food Insecurity: No Food Insecurity (04/21/2023)   Hunger Vital Sign    Worried About Running Out of Food in the Last Year: Never true    Ran Out of Food in the Last Year: Never true  Transportation Needs: No Transportation Needs (04/21/2023)   PRAPARE - Scientist, research (physical sciences) (Medical): No    Lack of Transportation (Non-Medical): No  Physical Activity: Inactive (04/21/2023)   Exercise Vital Sign    Days of Exercise per Week: 0 days    Minutes of Exercise per Session: 0 min  Stress: No Stress Concern Present (04/21/2023)   [employer] of Occupational Health - Occupational Stress Questionnaire    Feeling of Stress : Only a little  Social Connections: Moderately Integrated (04/21/2023)   Social Connection and Isolation Panel [NHANES]    Frequency of Communication with Friends and Family: More than three times a week    Frequency of Social Gatherings with Friends and Family: More than three times a week    Attends Religious Services: More than 4 times per year    Active Member of [employer] or Organizations: Yes    Attends Banker Meetings: More than 4 times per year    Marital Status: Widowed  Intimate Partner Violence: Not At Risk (04/18/2022)   Humiliation, Afraid, Rape, and Kick questionnaire    Fear of Current or Ex-Partner: No    Emotionally Abused: No    Physically Abused: No    Sexually Abused: No   Family Status  Relation Name Status   Mother  Deceased at age 69   Father  Deceased   Brother  Alive   Brother  Chemical engineer  (Not Specified)   Oceanographer  (Not Specified)   Emelda Brothers  (Not Specified)   Cousin  (Not Specified)   Neg Hx  (Not Specified)   Family History  Problem Relation Age of Onset   Arthritis Mother    Osteoarthritis Mother    Rheum arthritis Mother    Kidney disease Mother    Hyperlipidemia Father    Heart disease Father    Hyperlipidemia Brother    Heart disease Brother    Breast cancer Maternal Aunt    Uterine cancer Maternal Aunt    Stroke Paternal Aunt    Breast cancer Paternal Aunt    Ovarian cancer Cousin        x 2   Stomach cancer Neg Hx    Allergies  Allergen Reactions   Ibuprofen Anaphylaxis    REACTION: hives, tongue edema   Nsaids Anaphylaxis    REACTION: hives, tongue  edema      Review of Systems  Constitutional:  Negative for chills, fever and malaise/fatigue.  HENT:  Negative for congestion and hearing loss.   Eyes:  Negative for blurred vision and discharge.  Respiratory:  Negative for cough, sputum production and shortness of breath.   Cardiovascular:  Negative for chest pain, palpitations and leg swelling.  Gastrointestinal:  Negative for abdominal pain, blood in stool, constipation, diarrhea, heartburn, nausea and vomiting.  Genitourinary:  Negative for dysuria, frequency, hematuria and urgency.  Musculoskeletal:  Negative for back pain, falls and myalgias.  Skin:  Negative for rash.  Neurological:  Negative for dizziness, sensory change, loss of consciousness, weakness and headaches.  Endo/Heme/Allergies:  Negative for environmental allergies. Does not bruise/bleed easily.  Psychiatric/Behavioral:  Negative for depression and suicidal ideas. The patient is not nervous/anxious and does not have insomnia.       Objective:     BP 110/70 (BP Location: Left Arm, Patient Position: Sitting, Cuff Size: Normal)   Pulse 69   Temp 98.3 F (36.8 C) (Oral)   Resp 16   Ht 5\' 3"  (1.6 m)   Wt 173 lb (78.5 kg)   SpO2 95%   BMI 30.65 kg/m  BP Readings from Last 3 Encounters:  04/24/23 110/70  07/26/22 120/80  05/31/22 132/79   Wt Readings from Last 3 Encounters:  04/24/23 173 lb (78.5 kg)  07/26/22 174 lb (78.9 kg)  05/31/22 175 lb (79.4 kg)   SpO2 Readings from Last 3 Encounters:  04/24/23 95%  04/19/22 98%  12/07/21 98%      Physical Exam Vitals and nursing note reviewed.  Constitutional:      General: She is not in acute distress.    Appearance: Normal appearance. She is well-developed.  HENT:     Head: Normocephalic and atraumatic.  Eyes:     General: No scleral icterus.       Right eye: No discharge.        Left eye: No discharge.  Cardiovascular:     Rate and Rhythm: Normal rate and regular rhythm.     Heart sounds: No  murmur heard. Pulmonary:     Effort: Pulmonary effort is normal. No respiratory distress.     Breath sounds: Normal breath sounds.  Musculoskeletal:        General: Normal range of motion.     Cervical back: Normal range of motion and neck supple.     Right lower leg: No edema.     Left lower leg: No edema.  Skin:    General: Skin is warm and dry.  Neurological:     Mental Status: She is alert and oriented to person, place, and time.  Psychiatric:        Mood and Affect: Mood normal.        Behavior: Behavior normal.        Thought Content: Thought content normal.        Judgment: Judgment normal.      No results found for any visits on 04/24/23.  Last CBC Lab Results  Component Value Date   WBC 6.9 04/19/2022   HGB 13.7 04/19/2022   HCT 40.9 04/19/2022   MCV 89.9 04/19/2022   RDW 12.8 04/19/2022   PLT 301.0 04/19/2022   Last metabolic panel Lab Results  Component Value Date   GLUCOSE 89 04/19/2022   NA 142 04/19/2022   K 4.3 04/19/2022   CL 106 04/19/2022   CO2 29 04/19/2022   BUN 14 04/19/2022   CREATININE 1.28 (H) 04/19/2022   CALCIUM 9.5 04/19/2022   PROT 6.8 04/19/2022   ALBUMIN 4.4 04/19/2022   BILITOT 0.9 04/19/2022   ALKPHOS 59 04/19/2022   AST 19 04/19/2022   ALT 18 04/19/2022   Last lipids Lab Results  Component Value Date   CHOL 185 12/07/2021   HDL 43.50 12/07/2021  LDLCALC 113 (H) 12/07/2021   TRIG 145.0 12/07/2021   CHOLHDL 4 12/07/2021   Last hemoglobin A1c No results found for: "HGBA1C" Last thyroid functions Lab Results  Component Value Date   TSH 2.67 05/31/2021   Last vitamin D No results found for: "25OHVITD2", "25OHVITD3", "VD25OH" Last vitamin B12 and Folate No results found for: "VITAMINB12", "FOLATE"    The 10-year ASCVD risk score (Arnett DK, et al., 2019) is: 10.9%    Assessment & Plan:   Problem List Items Addressed This Visit       Unprioritized   MORBID OBESITY   Relevant Orders   CBC with  Differential/Platelet   Comprehensive metabolic panel   Lipid panel   TSH   Lower extremity edema   Relevant Orders   Comprehensive metabolic panel   TSH   Elevated BP without diagnosis of hypertension   Relevant Orders   CBC with Differential/Platelet   Comprehensive metabolic panel   Lipid panel   TSH   Preventative health care - Primary    Ghm utd Check labs  See AVS Health Maintenance  Topic Date Due   DEXA SCAN  01/26/2019   MAMMOGRAM  11/02/2022   COVID-19 Vaccine (1) 05/10/2023 (Originally 05/04/1951)   INFLUENZA VACCINE  05/25/2023   Medicare Annual Wellness (AWV)  04/20/2024   Colonoscopy  06/11/2024   DTaP/Tdap/Td (2 - Td or Tdap) 02/05/2025   Pneumonia Vaccine 22+ Years old  Completed   Hepatitis C Screening  Completed   Zoster Vaccines- Shingrix  Completed   HPV VACCINES  Aged Out         Mass of right wrist    ? Ganglion cyst  Not bothering her at this moment---- pt will return to office if inc in size       Relevant Orders   DG Wrist Complete Right   Other Visit Diagnoses     Hyperlipidemia, unspecified hyperlipidemia type       Relevant Orders   Comprehensive metabolic panel   Lipid panel      Assessment and Plan    Right Wrist Mass: Palpable mass on right wrist, non-tender, non-mobile. Suspected cyst or possible arthritis. No current discomfort. -Order wrist X-ray to further evaluate the mass.  Internal Hemorrhoids: Asymptomatic internal hemorrhoids reported from recent gynecological exam. -No intervention needed at this time as they are not causing discomfort.  General Health Maintenance: Recent mammogram, bone density, and Pap smear all reported as normal. Regular visits to eye doctor and dentist. History of hysterectomy but ovaries intact. Mother had Stage 3 kidney disease. -Continue regular screenings and check-ups. -Consider dermatology appointment for skin concerns.       No follow-ups on file.    Donato Schultz, DO

## 2023-04-24 NOTE — Assessment & Plan Note (Signed)
Ghm utd Check labs  See AVS Health Maintenance  Topic Date Due   DEXA SCAN  01/26/2019   MAMMOGRAM  11/02/2022   COVID-19 Vaccine (1) 05/10/2023 (Originally 05/04/1951)   INFLUENZA VACCINE  05/25/2023   Medicare Annual Wellness (AWV)  04/20/2024   Colonoscopy  06/11/2024   DTaP/Tdap/Td (2 - Td or Tdap) 02/05/2025   Pneumonia Vaccine 76+ Years old  Completed   Hepatitis C Screening  Completed   Zoster Vaccines- Shingrix  Completed   HPV VACCINES  Aged Out

## 2023-04-24 NOTE — Assessment & Plan Note (Signed)
?   Ganglion cyst  Not bothering her at this moment---- pt will return to office if inc in size

## 2023-04-25 LAB — COMPREHENSIVE METABOLIC PANEL
ALT: 24 U/L (ref 0–35)
AST: 26 U/L (ref 0–37)
Albumin: 4.3 g/dL (ref 3.5–5.2)
Alkaline Phosphatase: 63 U/L (ref 39–117)
BUN: 10 mg/dL (ref 6–23)
CO2: 30 mEq/L (ref 19–32)
Calcium: 10 mg/dL (ref 8.4–10.5)
Chloride: 105 mEq/L (ref 96–112)
Creatinine, Ser: 1.12 mg/dL (ref 0.40–1.20)
GFR: 48.45 mL/min — ABNORMAL LOW (ref >60.00)
Glucose, Bld: 85 mg/dL (ref 70–99)
Potassium: 4.8 mEq/L (ref 3.5–5.1)
Sodium: 142 mEq/L (ref 135–145)
Total Bilirubin: 0.9 mg/dL (ref 0.2–1.2)
Total Protein: 6.9 g/dL (ref 6.0–8.3)

## 2023-04-25 LAB — CBC WITH DIFFERENTIAL/PLATELET
Basophils Absolute: 0 10*3/uL (ref 0.0–0.1)
Basophils Relative: 0.5 % (ref 0.0–3.0)
Eosinophils Absolute: 0.3 10*3/uL (ref 0.0–0.7)
Eosinophils Relative: 4.6 % (ref 0.0–5.0)
HCT: 44.2 % (ref 36.0–46.0)
Hemoglobin: 14.7 g/dL (ref 12.0–15.0)
Lymphocytes Relative: 44.7 % (ref 12.0–46.0)
Lymphs Abs: 3.1 10*3/uL (ref 0.7–4.0)
MCHC: 33.2 g/dL (ref 30.0–36.0)
MCV: 90.9 fl (ref 78.0–100.0)
Monocytes Absolute: 0.5 10*3/uL (ref 0.1–1.0)
Monocytes Relative: 7.7 % (ref 3.0–12.0)
Neutro Abs: 3 10*3/uL (ref 1.4–7.7)
Neutrophils Relative %: 42.5 % — ABNORMAL LOW (ref 43.0–77.0)
Platelets: 342 10*3/uL (ref 150.0–400.0)
RBC: 4.86 Mil/uL (ref 3.87–5.11)
RDW: 12.9 % (ref 11.5–15.5)
WBC: 7 10*3/uL (ref 4.0–10.5)

## 2023-04-25 LAB — LIPID PANEL
Cholesterol: 206 mg/dL — ABNORMAL HIGH (ref 0–200)
HDL: 44.5 mg/dL (ref >39.00)
NonHDL: 161.43
Total CHOL/HDL Ratio: 5
Triglycerides: 215 mg/dL — ABNORMAL HIGH (ref 0.0–149.0)
VLDL: 43 mg/dL — ABNORMAL HIGH (ref 0.0–40.0)

## 2023-04-25 LAB — LDL CHOLESTEROL, DIRECT: Direct LDL: 136 mg/dL

## 2023-04-25 LAB — TSH: TSH: 2.62 u[IU]/mL (ref 0.35–5.50)

## 2023-04-26 DIAGNOSIS — L570 Actinic keratosis: Secondary | ICD-10-CM | POA: Diagnosis not present

## 2023-04-26 DIAGNOSIS — L821 Other seborrheic keratosis: Secondary | ICD-10-CM | POA: Diagnosis not present

## 2023-04-26 DIAGNOSIS — D485 Neoplasm of uncertain behavior of skin: Secondary | ICD-10-CM | POA: Diagnosis not present

## 2023-04-26 DIAGNOSIS — Z129 Encounter for screening for malignant neoplasm, site unspecified: Secondary | ICD-10-CM | POA: Diagnosis not present

## 2023-04-26 DIAGNOSIS — Q828 Other specified congenital malformations of skin: Secondary | ICD-10-CM | POA: Diagnosis not present

## 2023-04-26 DIAGNOSIS — D225 Melanocytic nevi of trunk: Secondary | ICD-10-CM | POA: Diagnosis not present

## 2023-04-26 DIAGNOSIS — L820 Inflamed seborrheic keratosis: Secondary | ICD-10-CM | POA: Diagnosis not present

## 2023-04-30 ENCOUNTER — Other Ambulatory Visit: Payer: Self-pay | Admitting: Family Medicine

## 2023-04-30 DIAGNOSIS — R2231 Localized swelling, mass and lump, right upper limb: Secondary | ICD-10-CM

## 2023-07-13 ENCOUNTER — Ambulatory Visit (HOSPITAL_BASED_OUTPATIENT_CLINIC_OR_DEPARTMENT_OTHER)
Admission: RE | Admit: 2023-07-13 | Discharge: 2023-07-13 | Disposition: A | Discharge status: Home / Self Care | Payer: Medicare Other | Source: Ambulatory Visit | Attending: Family Medicine | Admitting: Family Medicine

## 2023-07-13 DIAGNOSIS — R2231 Localized swelling, mass and lump, right upper limb: Secondary | ICD-10-CM | POA: Insufficient documentation

## 2023-07-13 DIAGNOSIS — M25431 Effusion, right wrist: Secondary | ICD-10-CM | POA: Diagnosis not present

## 2023-07-31 NOTE — Progress Notes (Unsigned)
PATIENT: Brianna Farrell DOB: [birth year]  REASON FOR VISIT: follow up HISTORY FROM: patient  No chief complaint on file.    HISTORY OF PRESENT ILLNESS:  07/31/23 ALL:  Brianna Farrell returns for follow up for OSA on CPAP. She was last seen 10/2021 and doing well. ONO suggested need for supplemental O2 but required CPAP titration. CPAP titration did not show need for O2. Since,   11/11/2021 ALL:  Brianna Farrell is a 72 y.o. female here today for follow up for OSA on CPAP.  She was seen in consult with Dr Vickey Huger 07/2021 for snoring and daytime sleepiness. HST 08/23/2021 showed OSA with AHI 22.1/hr with hypoxemia. AutoPAP ordered. She is doing well. She is tolerating CPAP. She feels that she is resting better and waking more refreshed. She had ONO two nights ago and awaiting results. She is using dreamwear FFM. She feels it fits most of the time but may need to be adjusted.     HISTORY: (copied from Dr Dohmeier's previous note)  Brianna Farrell is a 72 year- old Caucasian patient seen here as a referral  by PCP on 08/12/2021  Chief concern according to patient : " I can easily go to sleep, when ever not active and not stimulated. My late husband had sleep apnea and I never thought I would have it, but now I snore- my daughter told me and have woken up by my own snoring, but I am tired and my sleep is not refreshing, I wake with a dry mouth- I never was  a morning person".    Brianna Farrell  has a past medical history of Anemia, Arthritis, Chicken pox, GERD (gastroesophageal reflux disease), History of stomach ulcers, HTN (hypertension), Hyperlipidemia, IBS (irritable bowel syndrome), Macular degeneration, Migraines, Pneumonia, and Urine incontinence.   Sleep relevant medical history: Nocturia one time-, Tonsillectomy at age 67,  Cervical spine- congential fusion , cant sleep flat on my back.     Family medical /sleep history: 2 living brothers, no other family member on CPAP with OSA. Chronic migraine in   daughter with insomnia.    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 In his last years he was demented and wandering.  She had been working as Heritage manager, Recruitment consultant, Nutritional therapist,  and lives in a household alone, one dog.  Tobacco [id number]  .  ETOH use none ,  Caffeine intake in form of Coffee( /) Soda( Dr Reino Kent 1-2 a day) Tea ( /) and no energy drinks. Regular exercise in form of walking, 30 minutes a day.    Sleep habits are as follows: The patient's dinner time is between 6-8 PM. The patient goes to bed at 10-12 PM , but she had some night up to 4 AM., averages 6 hours of sleep. Her dog will wake her to be let outside.    The preferred sleep position is in a recliner , and she tends to turn to the right side. with the support of a crumbled comforter instead of  pillows. Recliner sleeper due to back problems, hasn't slept in a bed in a long time, recliner in the den , not the bedroom.  Has GERD.  The TV is on a lot.  Plays games on the phone when she cant sleep Dreams are reportedly frequent.  9-10  AM is the usual rise time. The patient wakes up spontaneously hours before.  She reports not feeling refreshed or restored in AM, with symptoms such as dry mouth, pressure morning headaches, sinus headaches and residual fatigue.  Naps are taken infrequently, lasting from 1 to 2 hours- and are more  refreshing than nocturnal sleep.    REVIEW OF SYSTEMS: Out of a complete 14 system review of symptoms, the patient complains only of the following symptoms, none and all other reviewed systems are negative.  ESS: 10  ALLERGIES: Allergies  Allergen Reactions   Ibuprofen Anaphylaxis    REACTION: hives, tongue edema   Nsaids Anaphylaxis    REACTION: hives, tongue edema    HOME MEDICATIONS: No outpatient medications prior to visit.   No facility-administered medications prior to visit.    PAST MEDICAL  HISTORY: Past Medical History:  Diagnosis Date   Anemia    Arthritis    Cataract    Chicken pox    GERD (gastroesophageal reflux disease)    History of stomach ulcers    HTN (hypertension)    Hyperlipidemia    IBS (irritable bowel syndrome)    Macular degeneration    Migraines    Pneumonia    Urine incontinence     PAST SURGICAL HISTORY: Past Surgical History:  Procedure Laterality Date   ABDOMINAL HYSTERECTOMY     Still has Ovaries   APPENDECTOMY  10/25/1983   DILATION AND CURETTAGE OF UTERUS  10/24/1982   EYE SURGERY     SHOULDER SURGERY Right    Shoulder Manipulation   TONSILLECTOMY AND ADENOIDECTOMY  10/25/1951    FAMILY HISTORY: Family History  Problem Relation Age of Onset   Arthritis Mother    Osteoarthritis Mother    Rheum arthritis Mother    Kidney disease Mother    Hyperlipidemia Father    Heart disease Father    Hyperlipidemia Brother    Heart disease Brother    Breast cancer Maternal Aunt    Uterine cancer Maternal Aunt    Stroke Paternal Aunt    Breast cancer Paternal Aunt    Ovarian cancer Cousin        x 2   Stomach cancer Neg Hx     SOCIAL HISTORY: Social History   Socioeconomic History   Marital status: Widowed    Spouse name: Not on file   Number of children: 1   Years of education: Not on file   Highest education level: Not on file  Occupational History   Occupation: retired   Occupation: retired  Tobacco Use   Smoking status: Never   Smokeless tobacco: Never  Vaping Use   Vaping status: Never Used  Substance and Sexual Activity   Alcohol use: No   Drug use: No   Sexual activity: Not on file  Other Topics Concern   Not on file  Social History Narrative   Lives alone   Right handed   Caffeine: 20 oz of dr. Reino Kent, no coffee   Social Determinants of Health   Financial Resource Strain: Low Risk  (04/21/2023)   Overall Financial Resource Strain (CARDIA)    Difficulty of Paying Living Expenses: Not very hard  Food  Insecurity: No Food Insecurity (04/21/2023)   Hunger Vital Sign    Worried About Running Out of Food in the Last Year: Never true    Ran Out of Food in the Last Year: Never true  Transportation Needs: No Transportation Needs (04/21/2023)   PRAPARE - Administrator, Civil Service (Medical): No    Lack of Transportation (Non-Medical):  No  Physical Activity: Inactive (04/21/2023)   Exercise Vital Sign    Days of Exercise per Week: 0 days    Minutes of Exercise per Session: 0 min  Stress: No Stress Concern Present (04/21/2023)   [employer] of Occupational Health - Occupational Stress Questionnaire    Feeling of Stress : Only a little  Social Connections: Moderately Integrated (04/21/2023)   Social Connection and Isolation Panel [NHANES]    Frequency of Communication with Friends and Family: More than three times a week    Frequency of Social Gatherings with Friends and Family: More than three times a week    Attends Religious Services: More than 4 times per year    Active Member of [employer] or Organizations: Yes    Attends Banker Meetings: More than 4 times per year    Marital Status: Widowed  Intimate Partner Violence: Not At Risk (04/18/2022)   Humiliation, Afraid, Rape, and Kick questionnaire    Fear of Current or Ex-Partner: No    Emotionally Abused: No    Physically Abused: No    Sexually Abused: No     PHYSICAL EXAM  There were no vitals filed for this visit.  There is no height or weight on file to calculate BMI.  Generalized: Well developed, in no acute distress  Cardiology: normal rate and rhythm, no murmur noted Respiratory: clear to auscultation bilaterally  Neurological examination  Mentation: Alert oriented to time, place, history taking. Follows all commands speech and language fluent Cranial nerve II-XII: Pupils were equal round reactive to light. Extraocular movements were full, visual field were full  Motor: The motor testing reveals 5  over 5 strength of all 4 extremities. Good symmetric motor tone is noted throughout.  Gait and station: Gait is normal.    DIAGNOSTIC DATA (LABS, IMAGING, TESTING) - I reviewed patient records, labs, notes, testing and imaging myself where available.     09/27/2016    4:07 PM  MMSE - Mini Mental State Exam  Orientation to time 5  Orientation to Place 5  Registration 3  Attention/ Calculation 5  Recall 3  Language- name 2 objects 2  Language- repeat 1  Language- follow 3 step command 3  Language- read & follow direction 1  Write a sentence 1  Copy design 1  Total score 30     Lab Results  Component Value Date   WBC 7.0 04/24/2023   HGB 14.7 04/24/2023   HCT 44.2 04/24/2023   MCV 90.9 04/24/2023   PLT 342.0 04/24/2023      Component Value Date/Time   NA 142 04/24/2023 1421   K 4.8 04/24/2023 1421   CL 105 04/24/2023 1421   CO2 30 04/24/2023 1421   GLUCOSE 85 04/24/2023 1421   BUN 10 04/24/2023 1421   CREATININE 1.12 04/24/2023 1421   CALCIUM 10.0 04/24/2023 1421   PROT 6.9 04/24/2023 1421   ALBUMIN 4.3 04/24/2023 1421   AST 26 04/24/2023 1421   ALT 24 04/24/2023 1421   ALKPHOS 63 04/24/2023 1421   BILITOT 0.9 04/24/2023 1421   Lab Results  Component Value Date   CHOL 206 (H) 04/24/2023   HDL 44.50 04/24/2023   LDLCALC 113 (H) 12/07/2021   LDLDIRECT 136.0 04/24/2023   TRIG 215.0 (H) 04/24/2023   CHOLHDL 5 04/24/2023   No results found for: "HGBA1C" No results found for: "VITAMINB12" Lab Results  Component Value Date   TSH 2.62 04/24/2023     ASSESSMENT AND PLAN 72  y.o. year old female  has a past medical history of Anemia, Arthritis, Cataract, Chicken pox, GERD (gastroesophageal reflux disease), History of stomach ulcers, HTN (hypertension), Hyperlipidemia, IBS (irritable bowel syndrome), Macular degeneration, Migraines, Pneumonia, and Urine incontinence. here with   No diagnosis found.     Brianna Farrell is doing well on CPAP therapy. Compliance  report reveals excellent compliance. She was encouraged to continue using CPAP nightly and for greater than 4 hours each night. I will look for ONO results and add O2 if needed. She is feeling much better and waking more refreshed. She will continue to monitor for leak. No significant leak noted on compliance report. Risks of untreated sleep apnea review and education materials provided. Healthy lifestyle habits encouraged. She will follow up in 1 year, sooner if needed. She verbalizes understanding and agreement with this plan.    No orders of the defined types were placed in this encounter.    No orders of the defined types were placed in this encounter.     Shawnie Dapper, FNP-C 07/31/2023, 4:46 PM [facility] [address] [place], [place] [address] [phone]

## 2023-08-02 ENCOUNTER — Telehealth: Payer: Self-pay

## 2023-08-02 ENCOUNTER — Other Ambulatory Visit: Payer: Self-pay | Admitting: Family Medicine

## 2023-08-02 DIAGNOSIS — R2231 Localized swelling, mass and lump, right upper limb: Secondary | ICD-10-CM

## 2023-08-02 NOTE — Telephone Encounter (Signed)
Please see Mychart message from today 08/02/2023

## 2023-08-03 ENCOUNTER — Encounter: Payer: Self-pay | Admitting: Family Medicine

## 2023-08-03 ENCOUNTER — Ambulatory Visit: Payer: Medicare Other | Admitting: Family Medicine

## 2023-08-03 VITALS — BP 129/74 | HR 66 | Ht 63.0 in | Wt 176.5 lb

## 2023-08-03 DIAGNOSIS — G4733 Obstructive sleep apnea (adult) (pediatric): Secondary | ICD-10-CM | POA: Diagnosis not present

## 2023-08-03 NOTE — Patient Instructions (Signed)
Please continue using your CPAP regularly. While your insurance requires that you use CPAP at least 4 hours each night on 70% of the nights, I recommend, that you not skip any nights and use it throughout the night if you can. Getting used to CPAP and staying with the treatment long term does take time and patience and discipline. Untreated obstructive sleep apnea when it is moderate to severe can have an adverse impact on cardiovascular health and raise her risk for heart disease, arrhythmias, hypertension, congestive heart failure, stroke and diabetes. Untreated obstructive sleep apnea causes sleep disruption, nonrestorative sleep, and sleep deprivation. This can have an impact on your day to day functioning and cause daytime sleepiness and impairment of cognitive function, memory loss, mood disturbance, and problems focussing. Using CPAP regularly can improve these symptoms.  We will update supply orders, today. I will ask [employer] to do a mask refitting. Let them know about the dry mouth. Adjust your humidity on the machine. Consider retrial of Biotene products.   Follow up in 6 months

## 2023-08-27 ENCOUNTER — Ambulatory Visit
Admission: RE | Admit: 2023-08-27 | Discharge: 2023-08-27 | Disposition: A | Discharge status: Home / Self Care | Payer: Medicare Other | Source: Ambulatory Visit | Attending: Family Medicine | Admitting: Family Medicine

## 2023-08-27 DIAGNOSIS — R2231 Localized swelling, mass and lump, right upper limb: Secondary | ICD-10-CM | POA: Diagnosis not present

## 2023-08-27 DIAGNOSIS — M25531 Pain in right wrist: Secondary | ICD-10-CM | POA: Diagnosis not present

## 2023-08-27 MED ORDER — GADOPICLENOL 0.5 MMOL/ML IV SOLN
8.0000 mL | Freq: Once | INTRAVENOUS | Status: AC | PRN
  Administered 2023-08-27: 8 mL via INTRAVENOUS

## 2023-10-04 ENCOUNTER — Encounter: Payer: Self-pay | Admitting: Family Medicine

## 2023-10-25 HISTORY — PX: BASAL CELL CARCINOMA EXCISION: SHX1214

## 2023-10-25 HISTORY — PX: COLONOSCOPY WITH ESOPHAGOGASTRODUODENOSCOPY (EGD): SHX5779

## 2024-02-21 NOTE — Progress Notes (Deleted)
 Brianna Farrell

## 2024-02-21 NOTE — Progress Notes (Deleted)
 PATIENT: Brianna Farrell DOB: 1951/02/10  REASON FOR VISIT: follow up HISTORY FROM: patient  No chief complaint on file.    HISTORY OF PRESENT ILLNESS:  02/21/24 ALL:  Brianna Farrell returns for follow up for OSA on CPAP.   08/03/2023 ALL:  Brianna Farrell returns for follow up for OSA on CPAP. She was last seen 10/2021 and doing well. ONO suggested need for supplemental O2 but required CPAP titration. CPAP titration did not show need for O2. Since, she reports having difficulty tolerating CPAP. She was diagnosed with Covid last year and has had a hard time tolerating CPAP since. She reports dry mouth and a tickle cough when using therapy. She has very little data available but review of 15 days usage in 11/2022 showed AHI well managed at 0.7/h. No significant leak noted, although she reports having a leak in her mask at home. Using FFM. She does feel better rested when using CPAP.   11/11/2021 ALL:  Brianna Farrell is a 73 y.o. female here today for follow up for OSA on CPAP.  She was seen in consult with Dr Albertina Hugger 07/2021 for snoring and daytime sleepiness. HST 08/23/2021 showed OSA with AHI 22.1/hr with hypoxemia. AutoPAP ordered. She is doing well. She is tolerating CPAP. She feels that she is resting better and waking more refreshed. She had ONO two nights ago and awaiting results. She is using dreamwear FFM. She feels it fits most of the time but may need to be adjusted.     HISTORY: (copied from Dr Dohmeier's previous note)  Brianna Farrell is a 73 year- old Caucasian patient seen here as a referral  by PCP on 08/12/2021  Chief concern according to patient : " I can easily go to sleep, when ever not active and not stimulated. My late husband had sleep apnea and I never thought I would have it, but now I snore- my daughter told me and have woken up by my own snoring, but I am tired and my sleep is not refreshing, I wake with a dry mouth- I never was  a morning person".    Brianna Farrell  has a past medical  history of Anemia, Arthritis, Chicken pox, GERD (gastroesophageal reflux disease), History of stomach ulcers, HTN (hypertension), Hyperlipidemia, IBS (irritable bowel syndrome), Macular degeneration, Migraines, Pneumonia, and Urine incontinence.   Sleep relevant medical history: Nocturia one time-, Tonsillectomy at age 13,  Cervical spine- congential fusion , cant sleep flat on my back.     Family medical /sleep history: 2 living brothers, no other family member on CPAP with OSA. Chronic migraine in  daughter with insomnia.    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 In his last years he was demented and wandering.  She had been working as Heritage manager, Recruitment consultant, Nutritional therapist,  and lives in a household alone, one dog.  Tobacco [id number]  .  ETOH use none ,  Caffeine intake in form of Coffee( /) Soda( Dr Kathlene Paradise 1-2 a day) Tea ( /) and no energy drinks. Regular exercise in form of walking, 30 minutes a day.    Sleep habits are as follows: The patient's dinner time is between 6-8 PM. The patient goes to bed at 10-12 PM , but she had some night up to 4 AM., averages 6 hours of sleep. Her dog will wake her to be let outside.    The  preferred sleep position is in a recliner , and she tends to turn to the right side. with the support of a crumbled comforter instead of  pillows. Recliner sleeper due to back problems, hasn't slept in a bed in a long time, recliner in the den , not the bedroom.  Has GERD.  The TV is on a lot.  Plays games on the phone when she cant sleep Dreams are reportedly frequent.  9-10  AM is the usual rise time. The patient wakes up spontaneously hours before.   She reports not feeling refreshed or restored in AM, with symptoms such as dry mouth, pressure morning headaches, sinus headaches and residual fatigue.  Naps are taken infrequently, lasting from 1 to 2 hours- and are more  refreshing  than nocturnal sleep.    REVIEW OF SYSTEMS: Out of a complete 14 system review of symptoms, the patient complains only of the following symptoms, none and all other reviewed systems are negative.  ESS: 11/24, previously 10  ALLERGIES: Allergies  Allergen Reactions   Ibuprofen Anaphylaxis    REACTION: hives, tongue edema   Nsaids Anaphylaxis    REACTION: hives, tongue edema    HOME MEDICATIONS: No outpatient medications prior to visit.   No facility-administered medications prior to visit.    PAST MEDICAL HISTORY: Past Medical History:  Diagnosis Date   Anemia    Arthritis    Cataract    Chicken pox    GERD (gastroesophageal reflux disease)    History of stomach ulcers    HTN (hypertension)    Hyperlipidemia    IBS (irritable bowel syndrome)    Macular degeneration    Migraines    Pneumonia    Urine incontinence     PAST SURGICAL HISTORY: Past Surgical History:  Procedure Laterality Date   ABDOMINAL HYSTERECTOMY     Still has Ovaries   APPENDECTOMY  10/25/1983   DILATION AND CURETTAGE OF UTERUS  10/24/1982   EYE SURGERY     SHOULDER SURGERY Right    Shoulder Manipulation   TONSILLECTOMY AND ADENOIDECTOMY  10/25/1951    FAMILY HISTORY: Family History  Problem Relation Age of Onset   Arthritis Mother    Osteoarthritis Mother    Rheum arthritis Mother    Kidney disease Mother    Hyperlipidemia Father    Heart disease Father    Hyperlipidemia Brother    Heart disease Brother    Breast cancer Maternal Aunt    Uterine cancer Maternal Aunt    Stroke Paternal Aunt    Breast cancer Paternal Aunt    Ovarian cancer Cousin        x 2   Stomach cancer Neg Hx     SOCIAL HISTORY: Social History   Socioeconomic History   Marital status: Widowed    Spouse name: Not on file   Number of children: 1   Years of education: Not on file   Highest education level: Not on file  Occupational History   Occupation: retired   Occupation: retired  Tobacco Use    Smoking status: Never   Smokeless tobacco: Never  Vaping Use   Vaping status: Never Used  Substance and Sexual Activity   Alcohol use: No   Drug use: No   Sexual activity: Not on file  Other Topics Concern   Not on file  Social History Narrative   Lives alone   Right handed   Caffeine: 20 oz of dr. Kathlene Paradise, no coffee   Social Drivers of  Health   Financial Resource Strain: Low Risk  (04/21/2023)   Overall Financial Resource Strain (CARDIA)    Difficulty of Paying Living Expenses: Not very hard  Food Insecurity: No Food Insecurity (04/21/2023)   Hunger Vital Sign    Worried About Running Out of Food in the Last Year: Never true    Ran Out of Food in the Last Year: Never true  Transportation Needs: No Transportation Needs (04/21/2023)   PRAPARE - Administrator, Civil Service (Medical): No    Lack of Transportation (Non-Medical): No  Physical Activity: Inactive (04/21/2023)   Exercise Vital Sign    Days of Exercise per Week: 0 days    Minutes of Exercise per Session: 0 min  Stress: No Stress Concern Present (04/21/2023)   [employer] of Occupational Health - Occupational Stress Questionnaire    Feeling of Stress : Only a little  Social Connections: Moderately Integrated (04/21/2023)   Social Connection and Isolation Panel [NHANES]    Frequency of Communication with Friends and Family: More than three times a week    Frequency of Social Gatherings with Friends and Family: More than three times a week    Attends Religious Services: More than 4 times per year    Active Member of [employer] or Organizations: Yes    Attends Banker Meetings: More than 4 times per year    Marital Status: Widowed  Intimate Partner Violence: Not At Risk (04/18/2022)   Humiliation, Afraid, Rape, and Kick questionnaire    Fear of Current or Ex-Partner: No    Emotionally Abused: No    Physically Abused: No    Sexually Abused: No     PHYSICAL EXAM  There were no vitals filed  for this visit.   There is no height or weight on file to calculate BMI.  Generalized: Well developed, in no acute distress  Cardiology: normal rate and rhythm, no murmur noted Respiratory: clear to auscultation bilaterally  Neurological examination  Mentation: Alert oriented to time, place, history taking. Follows all commands speech and language fluent Cranial nerve II-XII: Pupils were equal round reactive to light. Extraocular movements were full, visual field were full  Motor: The motor testing reveals 5 over 5 strength of all 4 extremities. Good symmetric motor tone is noted throughout.  Gait and station: Gait is normal.    DIAGNOSTIC DATA (LABS, IMAGING, TESTING) - I reviewed patient records, labs, notes, testing and imaging myself where available.     09/27/2016    4:07 PM  MMSE - Mini Mental State Exam  Orientation to time 5  Orientation to Place 5  Registration 3  Attention/ Calculation 5  Recall 3  Language- name 2 objects 2  Language- repeat 1  Language- follow 3 step command 3  Language- read & follow direction 1  Write a sentence 1  Copy design 1  Total score 30     Lab Results  Component Value Date   WBC 7.0 04/24/2023   HGB 14.7 04/24/2023   HCT 44.2 04/24/2023   MCV 90.9 04/24/2023   PLT 342.0 04/24/2023      Component Value Date/Time   NA 142 04/24/2023 1421   K 4.8 04/24/2023 1421   CL 105 04/24/2023 1421   CO2 30 04/24/2023 1421   GLUCOSE 85 04/24/2023 1421   BUN 10 04/24/2023 1421   CREATININE 1.12 04/24/2023 1421   CALCIUM  10.0 04/24/2023 1421   PROT 6.9 04/24/2023 1421   ALBUMIN 4.3 04/24/2023 1421  AST 26 04/24/2023 1421   ALT 24 04/24/2023 1421   ALKPHOS 63 04/24/2023 1421   BILITOT 0.9 04/24/2023 1421   Lab Results  Component Value Date   CHOL 206 (H) 04/24/2023   HDL 44.50 04/24/2023   LDLCALC 113 (H) 12/07/2021   LDLDIRECT 136.0 04/24/2023   TRIG 215.0 (H) 04/24/2023   CHOLHDL 5 04/24/2023   No results found for:  "HGBA1C" No results found for: "VITAMINB12" Lab Results  Component Value Date   TSH 2.62 04/24/2023     ASSESSMENT AND PLAN 73 y.o. year old female  has a past medical history of Anemia, Arthritis, Cataract, Chicken pox, GERD (gastroesophageal reflux disease), History of stomach ulcers, HTN (hypertension), Hyperlipidemia, IBS (irritable bowel syndrome), Macular degeneration, Migraines, Pneumonia, and Urine incontinence. here with   No diagnosis found.   Brianna Farrell is doing well on CPAP therapy. Compliance report reveals very little usage over the past year. I have encouraged her to resume nightly use of CPAP. She will continue to monitor for leak. No significant leak noted on compliance report. I will send supply orders and ask DME to assess for correct mask seal. Consider mask refitting if needed. May use Biotene products. Risks of untreated sleep apnea review and education materials provided. Healthy lifestyle habits encouraged. She will follow up in 6 months, sooner if needed. She verbalizes understanding and agreement with this plan.    No orders of the defined types were placed in this encounter.    No orders of the defined types were placed in this encounter.     Terrilyn Fick, FNP-C 02/21/2024, 11:41 AM [facility] [address], [address] [place], [place] [address] [phone]

## 2024-02-22 ENCOUNTER — Ambulatory Visit: Payer: Medicare Other | Admitting: Family Medicine

## 2024-02-22 DIAGNOSIS — G4733 Obstructive sleep apnea (adult) (pediatric): Secondary | ICD-10-CM

## 2024-04-09 DIAGNOSIS — H353131 Nonexudative age-related macular degeneration, bilateral, early dry stage: Secondary | ICD-10-CM | POA: Diagnosis not present

## 2024-04-09 DIAGNOSIS — Z8669 Personal history of other diseases of the nervous system and sense organs: Secondary | ICD-10-CM | POA: Diagnosis not present

## 2024-04-09 DIAGNOSIS — H04123 Dry eye syndrome of bilateral lacrimal glands: Secondary | ICD-10-CM | POA: Diagnosis not present

## 2024-04-09 DIAGNOSIS — H52223 Regular astigmatism, bilateral: Secondary | ICD-10-CM | POA: Diagnosis not present

## 2024-04-09 DIAGNOSIS — H26493 Other secondary cataract, bilateral: Secondary | ICD-10-CM | POA: Diagnosis not present

## 2024-04-09 DIAGNOSIS — H40059 Ocular hypertension, unspecified eye: Secondary | ICD-10-CM | POA: Diagnosis not present

## 2024-04-24 ENCOUNTER — Ambulatory Visit: Admitting: *Deleted

## 2024-04-24 VITALS — Ht 63.0 in | Wt 173.0 lb

## 2024-04-24 DIAGNOSIS — Z0000 Encounter for general adult medical examination without abnormal findings: Secondary | ICD-10-CM | POA: Diagnosis not present

## 2024-04-24 NOTE — Patient Instructions (Signed)
 Brianna Farrell , Thank you for taking time out of your busy schedule to complete your Annual Wellness Visit with me. I enjoyed our conversation and look forward to speaking with you again next year. I, as well as your care team,  appreciate your ongoing commitment to your health goals. Please review the following plan we discussed and let me know if I can assist you in the future. Your Game plan/ To Do List    Follow up Visits: Next Medicare AWV with our clinical staff: 04/29/25 3pm    Next Office Visit with your provider: 05/16/24 11am, fasting for physical.  Clinician Recommendations:  Aim for 30 minutes of exercise or brisk walking, 6-8 glasses of water, and 5 servings of fruits and vegetables each day.        This is a list of the screening recommended for you and due dates:  Health Maintenance  Topic Date Due   DEXA scan (bone density measurement)  01/26/2019   Mammogram  11/02/2022   COVID-19 Vaccine (1 - 2024-25 season) Never done   Medicare Annual Wellness Visit  04/20/2024   Colon Cancer Screening  06/11/2024   Flu Shot  05/24/2024   DTaP/Tdap/Td vaccine (2 - Td or Tdap) 02/05/2025   Pneumococcal Vaccine for age over 72  Completed   Hepatitis C Screening  Completed   Zoster (Shingles) Vaccine  Completed   Hepatitis B Vaccine  Aged Out   HPV Vaccine  Aged Out   Meningitis B Vaccine  Aged Out    Advanced directives: (Copy Requested) Please bring a copy of your health care power of attorney and living will to the office to be added to your chart at your convenience. You can mail to [facility] [address] [place], [place] [address] or email to [email or web address] Advance Care Planning is important because it:  [x]  Makes sure you receive the medical care that is consistent with your values, goals, and preferences  [x]  It provides guidance to your family and loved ones and reduces their decisional burden about whether or not they are making the right  decisions based on your wishes.  Follow the link provided in your after visit summary or read over the paperwork we have mailed to you to help you started getting your Advance Directives in place. If you need assistance in completing these, please reach out to us  so that we can help you!  See attachments for Preventive Care and Fall Prevention Tips.

## 2024-04-24 NOTE — Progress Notes (Signed)
 Please attest this visit in the absence of patient primary care provider.    Subjective:   Brianna Farrell is a 73 y.o. who presents for a Medicare Wellness preventive visit.  As a reminder, Annual Wellness Visits don't include a physical exam, and some assessments may be limited, especially if this visit is performed virtually. We may recommend an in-person follow-up visit with your provider if needed.  Visit Complete: Virtual I connected with  Brianna Farrell on 04/24/24 by a audio enabled telemedicine application and verified that I am speaking with the correct person using two identifiers.  Patient Location: Home  Provider Location: Office/Clinic  I discussed the limitations of evaluation and management by telemedicine. The patient expressed understanding and agreed to proceed.  Vital Signs: Because this visit was a virtual/telehealth visit, some criteria may be missing or patient reported. Any vitals not documented were not able to be obtained and vitals that have been documented are patient reported.  VideoDeclined- This patient declined Librarian, academic. Therefore the visit was completed with audio only.  Persons Participating in Visit: Patient.  AWV Questionnaire: No: Patient Medicare AWV questionnaire was not completed prior to this visit.  Cardiac Risk Factors include: advanced age (>19men, >22 women);dyslipidemia;obesity (BMI >30kg/m2);Other (see comment), Risk factor comments: OSA     Objective:    Today's Vitals   04/24/24 1456  Weight: 173 lb (78.5 kg)  Height: 5' 3 (1.6 m)   Body mass index is 30.65 kg/m.     04/24/2024    3:10 PM 04/21/2023    3:14 PM 04/18/2022   12:31 PM 04/15/2021    3:48 PM 05/31/2019    2:14 PM 05/29/2018    2:17 PM 09/27/2016    4:01 PM  Advanced Directives  Does Patient Have a Medical Advance Directive? Yes Yes Yes Yes No Yes  Yes   Type of Advance Directive Living will Healthcare Power of [place];Living will  Healthcare Power of [place];Living will Healthcare Power of [place];Living will  Healthcare Power of [place];Living will Healthcare Power of [place];Living will  Does patient want to make changes to medical advance directive? No - Patient declined No - Patient declined     No - Patient declined   Copy of Healthcare Power of Attorney in Chart?  No - copy requested No - copy requested No - copy requested  No - copy requested  No - copy requested   Would patient like information on creating a medical advance directive?     No - Patient declined        Data saved with a previous flowsheet row definition    Current Medications (verified) No outpatient encounter medications on file as of 04/24/2024.   No facility-administered encounter medications on file as of 04/24/2024.    Allergies (verified) Ibuprofen and Nsaids   History: Past Medical History:  Diagnosis Date   Anemia    Arthritis    Cataract    Chicken pox    GERD (gastroesophageal reflux disease)    History of stomach ulcers    HTN (hypertension)    Hyperlipidemia    IBS (irritable bowel syndrome)    Macular degeneration    Migraines    Pneumonia    Urine incontinence    Past Surgical History:  Procedure Laterality Date   ABDOMINAL HYSTERECTOMY     Still has Ovaries   APPENDECTOMY  10/25/1983   DILATION AND CURETTAGE OF UTERUS  10/24/1982   EYE SURGERY  SHOULDER SURGERY Right    Shoulder Manipulation   TONSILLECTOMY AND ADENOIDECTOMY  10/25/1951   Family History  Problem Relation Age of Onset   Arthritis Mother    Osteoarthritis Mother    Rheum arthritis Mother    Kidney disease Mother    Hyperlipidemia Father    Heart disease Father    Hyperlipidemia Brother    Heart disease Brother    Breast cancer Maternal Aunt    Uterine cancer Maternal Aunt    Stroke Paternal Aunt    Breast cancer Paternal Aunt    Ovarian cancer Cousin        x 2   Stomach cancer Neg Hx    Social History   Socioeconomic  History   Marital status: Widowed    Spouse name: Not on file   Number of children: 1   Years of education: Not on file   Highest education level: Not on file  Occupational History   Occupation: retired   Occupation: retired  Tobacco Use   Smoking status: Never   Smokeless tobacco: Never  Vaping Use   Vaping status: Never Used  Substance and Sexual Activity   Alcohol use: No   Drug use: No   Sexual activity: Not on file  Other Topics Concern   Not on file  Social History Narrative   Lives alone   Right handed   Caffeine: 20 oz of dr. Nunzio, no coffee   Social Drivers of Health   Financial Resource Strain: Low Risk  (04/24/2024)   Overall Financial Resource Strain (CARDIA)    Difficulty of Paying Living Expenses: Not very hard  Food Insecurity: No Food Insecurity (04/24/2024)   Hunger Vital Sign    Worried About Running Out of Food in the Last Year: Never true    Ran Out of Food in the Last Year: Never true  Transportation Needs: No Transportation Needs (04/24/2024)   PRAPARE - Administrator, Civil Service (Medical): No    Lack of Transportation (Non-Medical): No  Physical Activity: Inactive (04/24/2024)   Exercise Vital Sign    Days of Exercise per Week: 0 days    Minutes of Exercise per Session: 0 min  Stress: No Stress Concern Present (04/24/2024)   [employer] of Occupational Health - Occupational Stress Questionnaire    Feeling of Stress: Not at all  Social Connections: Moderately Integrated (04/24/2024)   Social Connection and Isolation Panel    Frequency of Communication with Friends and Family: More than three times a week    Frequency of Social Gatherings with Friends and Family: More than three times a week    Attends Religious Services: More than 4 times per year    Active Member of [employer] or Organizations: Yes    Attends Banker Meetings: More than 4 times per year    Marital Status: Widowed    Tobacco Counseling Counseling given:  Not Answered    Clinical Intake:  Pre-visit preparation completed: Yes  Pain : No/denies pain     BMI - recorded: 30.65 Nutritional Risks: None Diabetes: No  No results found for: HGBA1C   How often do you need to have someone help you when you read instructions, pamphlets, or other written materials from your doctor or pharmacy?: 1 - Never  Interpreter Needed?: No  Information entered by :: Lolita Libra, CMA   Activities of Daily Living     04/24/2024    3:13 PM  In your present state of  health, do you have any difficulty performing the following activities:  Hearing? 0  Vision? 0  Difficulty concentrating or making decisions? 0  Walking or climbing stairs? 0  Dressing or bathing? 0  Doing errands, shopping? 0  Preparing Food and eating ? N  Using the Toilet? N  In the past six months, have you accidently leaked urine? Y  Comment has leaked since she had children wears a liner and doesn't want to discuss with PCP  Do you have problems with loss of bowel control? N  Managing your Medications? N  Managing your Finances? N  Housekeeping or managing your Housekeeping? N    Patient Care Team: Antonio Meth, Jamee SAUNDERS, DO as PCP - General (Family Medicine) Latisha Medford, MD as Consulting Physician (Obstetrics and Gynecology) [facility] Fre[place], Elnor ORN, OD (Optometry) Hollar, Carlin Bullard, MD as Referring Physician (Dermatology)  I have updated your Care Teams any recent Medical Services you may have received from other providers in the past year.     Assessment:   This is a routine wellness examination for [place].  Hearing/Vision screen Hearing Screening - Comments:: Has bilateral decreased hearing. Has difficulty with childrens voices and those who speak lowly.  Vision Screening - Comments:: Wears RX glasses -- up to date with routine eye exams. With [facility] in [place] .    Goals Addressed             This Visit's  Progress    Maintain current health.   On track      Depression Screen     04/24/2024    3:08 PM 04/21/2023    3:12 PM 04/18/2022   12:34 PM 05/31/2021    1:08 PM 04/15/2021    3:50 PM 05/31/2019    2:14 PM 05/29/2018    2:17 PM  PHQ 2/9 Scores  PHQ - 2 Score 0 0 0 0 0 0 0    Fall Risk     04/24/2024    3:04 PM 04/21/2023    3:03 PM 04/18/2022   12:32 PM 05/31/2021    1:08 PM 04/15/2021    3:50 PM  Fall Risk   Falls in the past year? 0 1 1 0 0  Comment  rubber sole of shoe gripped the ground     Number falls in past yr: 0 0 0 0 0  Injury with Fall? 0 0 0 0 0  Risk for fall due to : No Fall Risks No Fall Risks History of fall(s)    Follow up Falls evaluation completed Falls evaluation completed Falls prevention discussed   Falls prevention discussed      Data saved with a previous flowsheet row definition    MEDICARE RISK AT HOME:  Medicare Risk at Home Any stairs in or around the home?: Yes If so, are there any without handrails?: No Home free of loose throw rugs in walkways, pet beds, electrical cords, etc?: Yes Adequate lighting in your home to reduce risk of falls?: Yes Life alert?: No Use of a cane, walker or w/c?: No Grab bars in the bathroom?: No Shower chair or bench in shower?: No (has one if needed) Elevated toilet seat or a handicapped toilet?: No  TIMED UP AND GO:  Was the test performed?  No,audio  Cognitive Function: 6CIT completed    09/27/2016    4:07 PM  MMSE - Mini Mental State Exam  Orientation to time 5   Orientation to Place 5   Registration  3   Attention/ Calculation 5   Recall 3   Language- name 2 objects 2   Language- repeat 1  Language- follow 3 step command 3   Language- read & follow direction 1   Write a sentence 1   Copy design 1   Total score 30      Data saved with a previous flowsheet row definition        04/24/2024    3:14 PM 04/21/2023    3:15 PM  6CIT Screen  What Year? 0 points 0 points  What month? 0 points 0 points  What  time? 0 points 0 points  Count back from 20 0 points 0 points  Months in reverse 0 points 0 points  Repeat phrase 0 points 0 points  Total Score 0 points 0 points    Immunizations Immunization History  Administered Date(s) Administered   Influenza, High Dose Seasonal PF 08/23/2016, 08/30/2017   Influenza-Unspecified 08/24/2014, 07/25/2015, 08/24/2018   Pneumococcal Conjugate-13 01/05/2015   Pneumococcal Polysaccharide-23 09/27/2016   Tdap 02/06/2015   Zoster Recombinant(Shingrix) 11/05/2018, 01/15/2019   Zoster, Live 09/30/2014    Screening Tests Health Maintenance  Topic Date Due   DEXA SCAN  01/26/2019   MAMMOGRAM  11/02/2022   COVID-19 Vaccine (1 - 2024-25 season) Never done   Colonoscopy  06/11/2024   INFLUENZA VACCINE  05/24/2024   DTaP/Tdap/Td (2 - Td or Tdap) 02/05/2025   Medicare Annual Wellness (AWV)  04/24/2025   Pneumococcal Vaccine: 50+ Years  Completed   Hepatitis C Screening  Completed   Zoster Vaccines- Shingrix  Completed   Hepatitis B Vaccines  Aged Out   HPV VACCINES  Aged Out   Meningococcal B Vaccine  Aged Out    Health Maintenance  Health Maintenance Due  Topic Date Due   DEXA SCAN  01/26/2019   MAMMOGRAM  11/02/2022   COVID-19 Vaccine (1 - 2024-25 season) Never done   Colonoscopy  06/11/2024   Health Maintenance Items Addressed: Will completed mammogram and Dexa tomorrow with [facility].  Additional Screening:  Vision Screening: Recommended annual ophthalmology exams for early detection of glaucoma and other disorders of the eye. Would you like a referral to an eye doctor? No    Dental Screening: Recommended annual dental exams for proper oral hygiene  Community Resource Referral / Chronic Care Management: CRR required this visit?  No   CCM required this visit?  No   Plan:    I have personally reviewed and noted the following in the patient's chart:   Medical and social history Use of alcohol, tobacco or illicit  drugs  Current medications and supplements including opioid prescriptions. Patient is not currently taking opioid prescriptions. Functional ability and status Nutritional status Physical activity Advanced directives List of other physicians Hospitalizations, surgeries, and ER visits in previous 12 months Vitals Screenings to include cognitive, depression, and falls Referrals and appointments  In addition, I have reviewed and discussed with patient certain preventive protocols, quality metrics, and best practice recommendations. A written personalized care plan for preventive services as well as general preventive health recommendations were provided to patient.   Lolita Libra, CMA   04/24/2024   After Visit Summary: (MyChart) Due to this being a telephonic visit, the after visit summary with patients personalized plan was offered to patient via MyChart   Notes: Nothing significant to report at this time.

## 2024-04-25 DIAGNOSIS — Z1231 Encounter for screening mammogram for malignant neoplasm of breast: Secondary | ICD-10-CM | POA: Diagnosis not present

## 2024-04-25 DIAGNOSIS — M8588 Other specified disorders of bone density and structure, other site: Secondary | ICD-10-CM | POA: Diagnosis not present

## 2024-04-25 DIAGNOSIS — N958 Other specified menopausal and perimenopausal disorders: Secondary | ICD-10-CM | POA: Diagnosis not present

## 2024-04-25 DIAGNOSIS — Z01419 Encounter for gynecological examination (general) (routine) without abnormal findings: Secondary | ICD-10-CM | POA: Diagnosis not present

## 2024-04-25 DIAGNOSIS — Z6830 Body mass index (BMI) 30.0-30.9, adult: Secondary | ICD-10-CM | POA: Diagnosis not present

## 2024-04-25 LAB — HM DEXA SCAN

## 2024-04-25 LAB — HM MAMMOGRAPHY

## 2024-05-01 DIAGNOSIS — L820 Inflamed seborrheic keratosis: Secondary | ICD-10-CM | POA: Diagnosis not present

## 2024-05-01 DIAGNOSIS — D485 Neoplasm of uncertain behavior of skin: Secondary | ICD-10-CM | POA: Diagnosis not present

## 2024-05-01 DIAGNOSIS — L821 Other seborrheic keratosis: Secondary | ICD-10-CM | POA: Diagnosis not present

## 2024-05-01 DIAGNOSIS — D225 Melanocytic nevi of trunk: Secondary | ICD-10-CM | POA: Diagnosis not present

## 2024-05-01 DIAGNOSIS — B078 Other viral warts: Secondary | ICD-10-CM | POA: Diagnosis not present

## 2024-05-01 DIAGNOSIS — C4441 Basal cell carcinoma of skin of scalp and neck: Secondary | ICD-10-CM | POA: Diagnosis not present

## 2024-05-01 DIAGNOSIS — D2262 Melanocytic nevi of left upper limb, including shoulder: Secondary | ICD-10-CM | POA: Diagnosis not present

## 2024-05-01 DIAGNOSIS — D2261 Melanocytic nevi of right upper limb, including shoulder: Secondary | ICD-10-CM | POA: Diagnosis not present

## 2024-05-01 DIAGNOSIS — L814 Other melanin hyperpigmentation: Secondary | ICD-10-CM | POA: Diagnosis not present

## 2024-05-01 DIAGNOSIS — L570 Actinic keratosis: Secondary | ICD-10-CM | POA: Diagnosis not present

## 2024-05-15 DIAGNOSIS — C4441 Basal cell carcinoma of skin of scalp and neck: Secondary | ICD-10-CM | POA: Diagnosis not present

## 2024-05-16 ENCOUNTER — Encounter: Payer: Self-pay | Admitting: Family Medicine

## 2024-05-16 ENCOUNTER — Ambulatory Visit (INDEPENDENT_AMBULATORY_CARE_PROVIDER_SITE_OTHER): Admitting: Family Medicine

## 2024-05-16 VITALS — BP 110/72 | HR 65 | Temp 98.0°F | Resp 18 | Ht 63.0 in | Wt 170.6 lb

## 2024-05-16 DIAGNOSIS — Z0000 Encounter for general adult medical examination without abnormal findings: Secondary | ICD-10-CM

## 2024-05-16 DIAGNOSIS — R030 Elevated blood-pressure reading, without diagnosis of hypertension: Secondary | ICD-10-CM

## 2024-05-16 DIAGNOSIS — G4733 Obstructive sleep apnea (adult) (pediatric): Secondary | ICD-10-CM

## 2024-05-16 DIAGNOSIS — E785 Hyperlipidemia, unspecified: Secondary | ICD-10-CM | POA: Diagnosis not present

## 2024-05-16 DIAGNOSIS — M8580 Other specified disorders of bone density and structure, unspecified site: Secondary | ICD-10-CM

## 2024-05-16 DIAGNOSIS — R197 Diarrhea, unspecified: Secondary | ICD-10-CM

## 2024-05-16 LAB — CBC WITH DIFFERENTIAL/PLATELET
Basophils Absolute: 0 K/uL (ref 0.0–0.1)
Basophils Relative: 0.6 % (ref 0.0–3.0)
Eosinophils Absolute: 0.2 K/uL (ref 0.0–0.7)
Eosinophils Relative: 4.1 % (ref 0.0–5.0)
HCT: 44.4 % (ref 36.0–46.0)
Hemoglobin: 14.9 g/dL (ref 12.0–15.0)
Lymphocytes Relative: 46.7 % — ABNORMAL HIGH (ref 12.0–46.0)
Lymphs Abs: 2.6 K/uL (ref 0.7–4.0)
MCHC: 33.7 g/dL (ref 30.0–36.0)
MCV: 88.9 fl (ref 78.0–100.0)
Monocytes Absolute: 0.4 K/uL (ref 0.1–1.0)
Monocytes Relative: 7.1 % (ref 3.0–12.0)
Neutro Abs: 2.3 K/uL (ref 1.4–7.7)
Neutrophils Relative %: 41.5 % — ABNORMAL LOW (ref 43.0–77.0)
Platelets: 320 K/uL (ref 150.0–400.0)
RBC: 4.99 Mil/uL (ref 3.87–5.11)
RDW: 13 % (ref 11.5–15.5)
WBC: 5.5 K/uL (ref 4.0–10.5)

## 2024-05-16 LAB — COMPREHENSIVE METABOLIC PANEL WITH GFR
ALT: 18 U/L (ref 0–35)
AST: 22 U/L (ref 0–37)
Albumin: 4.6 g/dL (ref 3.5–5.2)
Alkaline Phosphatase: 65 U/L (ref 39–117)
BUN: 9 mg/dL (ref 6–23)
CO2: 31 meq/L (ref 19–32)
Calcium: 9.8 mg/dL (ref 8.4–10.5)
Chloride: 105 meq/L (ref 96–112)
Creatinine, Ser: 1.18 mg/dL (ref 0.40–1.20)
GFR: 45.18 mL/min — ABNORMAL LOW (ref >60.00)
Glucose, Bld: 103 mg/dL — ABNORMAL HIGH (ref 70–99)
Potassium: 4.7 meq/L (ref 3.5–5.1)
Sodium: 143 meq/L (ref 135–145)
Total Bilirubin: 1.6 mg/dL — ABNORMAL HIGH (ref 0.2–1.2)
Total Protein: 7.2 g/dL (ref 6.0–8.3)

## 2024-05-16 LAB — LIPID PANEL
Cholesterol: 216 mg/dL — ABNORMAL HIGH (ref 0–200)
HDL: 45.6 mg/dL (ref >39.00)
LDL Cholesterol: 145 mg/dL — ABNORMAL HIGH (ref 0–99)
NonHDL: 170.8
Total CHOL/HDL Ratio: 5
Triglycerides: 127 mg/dL (ref 0.0–149.0)
VLDL: 25.4 mg/dL (ref 0.0–40.0)

## 2024-05-16 NOTE — Assessment & Plan Note (Signed)
 Well controlled, no changes to meds. Encouraged heart healthy diet such as the DASH diet and exercise as tolerated.

## 2024-05-16 NOTE — Assessment & Plan Note (Signed)
 On cpap Per neuro

## 2024-05-16 NOTE — Assessment & Plan Note (Signed)
 Ghm utd Check labs  See AVS  Health Maintenance  Topic Date Due   COVID-19 Vaccine (1 - 2024-25 season) Never done   Colonoscopy  06/11/2024   INFLUENZA VACCINE  05/24/2024   DTaP/Tdap/Td (2 - Td or Tdap) 02/05/2025   Medicare Annual Wellness (AWV)  04/24/2025   MAMMOGRAM  04/25/2025   DEXA SCAN  04/25/2026   Pneumococcal Vaccine: 50+ Years  Completed   Hepatitis C Screening  Completed   Zoster Vaccines- Shingrix  Completed   Hepatitis B Vaccines  Aged Out   HPV VACCINES  Aged Out   Meningococcal B Vaccine  Aged Out

## 2024-05-16 NOTE — Progress Notes (Signed)
 Established Patient Office Visit  Subjective   Patient ID: Brianna Farrell, female    DOB: 1951/03/15  Age: 73 y.o. MRN: [id number]  Chief Complaint  Patient presents with   Annual Exam    Pt states fasting     HPI Discussed the use of AI scribe software for clinical note transcription with the patient, who gave verbal consent to proceed.  History of Present Illness Brianna Farrell is a 73 year old female with congenital neck fusion who presents with numbness in her hands and arms.  She experiences numbness in her hands and arms, particularly during activities such as crushing or peeling vegetables. The numbness affects the entire hand, including the pinky finger. She has an upcoming appointment with a neurologist to further evaluate these symptoms.  She has a history of congenital neck fusion. She notes soreness in her neck but no significant pain or restriction in movement.  She experiences intermittent diarrhea, which she attributes to her diet and a previous diagnosis of IBS. The diarrhea is not daily but can occur several times a day when present. She has a history of a clean colonoscopy except for one diverticular finding.  She mentions a previous issue with her right wrist, where a tendon issue and arthritis were noted, but she reports no current pain in the wrist. She has had MRIs in the past related to this issue.  She expresses dislike for the colonoscopy preparation process. She has a family history of high cholesterol and is aware of her elevated cholesterol levels from last year's labs.   Patient Active Problem List   Diagnosis Date Noted   Osteopenia 05/16/2024   Mass of right wrist 04/24/2023   Preventative health care 04/24/2023   Non-restorative sleep 12/16/2021   Chronic intermittent hypoxia with obstructive sleep apnea 08/27/2021   OSA (obstructive sleep apnea) 08/27/2021   Gastroesophageal reflux disease 06/01/2021   Snoring 06/01/2021   Lower extremity edema  06/01/2021   Elevated BP without diagnosis of hypertension 06/01/2021   MORBID OBESITY 06/14/2010   Hyperlipidemia LDL goal <100 04/20/2010   ANEMIA 04/20/2010   Arthropathy 04/20/2010   PALPITATIONS 04/20/2010   Past Medical History:  Diagnosis Date   Anemia    Arthritis    Cataract    Chicken pox    GERD (gastroesophageal reflux disease)    History of stomach ulcers    HTN (hypertension)    Hyperlipidemia    IBS (irritable bowel syndrome)    Macular degeneration    Migraines    Pneumonia    Urine incontinence    Past Surgical History:  Procedure Laterality Date   ABDOMINAL HYSTERECTOMY     Still has Ovaries   APPENDECTOMY  10/25/1983   DILATION AND CURETTAGE OF UTERUS  10/24/1982   EYE SURGERY     SHOULDER SURGERY Right    Shoulder Manipulation   TONSILLECTOMY AND ADENOIDECTOMY  10/25/1951   Social History   Tobacco Use   Smoking status: Never   Smokeless tobacco: Never  Vaping Use   Vaping status: Never Used  Substance Use Topics   Alcohol use: No   Drug use: No   Social History   Socioeconomic History   Marital status: Widowed    Spouse name: Not on file   Number of children: 1   Years of education: Not on file   Highest education level: 12th grade  Occupational History   Occupation: retired   Occupation: retired  Tobacco Use   Smoking status: Never  Smokeless tobacco: Never  Vaping Use   Vaping status: Never Used  Substance and Sexual Activity   Alcohol use: No   Drug use: No   Sexual activity: Not on file  Other Topics Concern   Not on file  Social History Narrative   Lives alone   Right handed   Caffeine: 20 oz of dr. Nunzio, no coffee   Social Drivers of Health   Financial Resource Strain: Low Risk  (05/09/2024)   Overall Financial Resource Strain (CARDIA)    Difficulty of Paying Living Expenses: Not very hard  Food Insecurity: No Food Insecurity (05/09/2024)   Hunger Vital Sign    Worried About Running Out of Food in the Last  Year: Never true    Ran Out of Food in the Last Year: Never true  Transportation Needs: No Transportation Needs (05/09/2024)   PRAPARE - Administrator, Civil Service (Medical): No    Lack of Transportation (Non-Medical): No  Physical Activity: Inactive (05/09/2024)   Exercise Vital Sign    Days of Exercise per Week: 0 days    Minutes of Exercise per Session: Not on file  Stress: No Stress Concern Present (05/09/2024)   [employer] of Occupational Health - Occupational Stress Questionnaire    Feeling of Stress: Not at all  Social Connections: Moderately Integrated (05/09/2024)   Social Connection and Isolation Panel    Frequency of Communication with Friends and Family: More than three times a week    Frequency of Social Gatherings with Friends and Family: More than three times a week    Attends Religious Services: More than 4 times per year    Active Member of [employer] or Organizations: Yes    Attends Banker Meetings: More than 4 times per year    Marital Status: Widowed  Intimate Partner Violence: Not At Risk (04/24/2024)   Humiliation, Afraid, Rape, and Kick questionnaire    Fear of Current or Ex-Partner: No    Emotionally Abused: No    Physically Abused: No    Sexually Abused: No   Family Status  Relation Name Status   Mother  Deceased at age 26   Father  Deceased   Brother  Alive   Brother  Chemical engineer  (Not Specified)   Oceanographer  (Not Specified)   Bruna Nyhan  (Not Specified)   Cousin  (Not Specified)   Neg Hx  (Not Specified)  No partnership data on file   Family History  Problem Relation Age of Onset   Arthritis Mother    Osteoarthritis Mother    Rheum arthritis Mother    Kidney disease Mother    Hyperlipidemia Father    Heart disease Father    Hyperlipidemia Brother    Heart disease Brother    Breast cancer Maternal Aunt    Uterine cancer Maternal Aunt    Stroke Paternal Aunt    Breast cancer Paternal Aunt    Ovarian cancer  Cousin        x 2   Stomach cancer Neg Hx    Allergies  Allergen Reactions   Ibuprofen Anaphylaxis    REACTION: hives, tongue edema   Nsaids Anaphylaxis    REACTION: hives, tongue edema      Review of Systems  Constitutional:  Negative for chills, fever and malaise/fatigue.  HENT:  Negative for congestion and hearing loss.   Eyes:  Negative for blurred vision and discharge.  Respiratory:  Negative for cough,  sputum production and shortness of breath.   Cardiovascular:  Negative for chest pain, palpitations and leg swelling.  Gastrointestinal:  Negative for abdominal pain, blood in stool, constipation, diarrhea, heartburn, nausea and vomiting.  Genitourinary:  Negative for dysuria, frequency, hematuria and urgency.  Musculoskeletal:  Negative for back pain, falls and myalgias.  Skin:  Negative for rash.  Neurological:  Negative for dizziness, sensory change, loss of consciousness, weakness and headaches.  Endo/Heme/Allergies:  Negative for environmental allergies. Does not bruise/bleed easily.  Psychiatric/Behavioral:  Negative for depression and suicidal ideas. The patient is not nervous/anxious and does not have insomnia.       Objective:     BP 110/72 (BP Location: Left Arm, Patient Position: Sitting, Cuff Size: Normal)   Pulse 65   Temp 98 F (36.7 C) (Oral)   Resp 18   Ht 5' 3 (1.6 m)   Wt 170 lb 9.6 oz (77.4 kg)   SpO2 95%   BMI 30.22 kg/m  BP Readings from Last 3 Encounters:  05/16/24 110/72  08/03/23 129/74  04/24/23 110/70   Wt Readings from Last 3 Encounters:  05/16/24 170 lb 9.6 oz (77.4 kg)  04/24/24 173 lb (78.5 kg)  08/03/23 176 lb 8 oz (80.1 kg)   SpO2 Readings from Last 3 Encounters:  05/16/24 95%  04/24/23 95%  04/19/22 98%      Physical Exam   No results found for any visits on 05/16/24.  Last CBC Lab Results  Component Value Date   WBC 7.0 04/24/2023   HGB 14.7 04/24/2023   HCT 44.2 04/24/2023   MCV 90.9 04/24/2023   RDW 12.9  04/24/2023   PLT 342.0 04/24/2023   Last metabolic panel Lab Results  Component Value Date   GLUCOSE 85 04/24/2023   NA 142 04/24/2023   K 4.8 04/24/2023   CL 105 04/24/2023   CO2 30 04/24/2023   BUN 10 04/24/2023   CREATININE 1.12 04/24/2023   GFR 48.45 (L) 04/24/2023   CALCIUM  10.0 04/24/2023   PROT 6.9 04/24/2023   ALBUMIN 4.3 04/24/2023   BILITOT 0.9 04/24/2023   ALKPHOS 63 04/24/2023   AST 26 04/24/2023   ALT 24 04/24/2023   Last lipids Lab Results  Component Value Date   CHOL 206 (H) 04/24/2023   HDL 44.50 04/24/2023   LDLCALC 113 (H) 12/07/2021   LDLDIRECT 136.0 04/24/2023   TRIG 215.0 (H) 04/24/2023   CHOLHDL 5 04/24/2023   Last hemoglobin A1c No results found for: HGBA1C Last thyroid  functions Lab Results  Component Value Date   TSH 2.62 04/24/2023   Last vitamin D No results found for: 25OHVITD2, 25OHVITD3, VD25OH Last vitamin B12 and Folate No results found for: VITAMINB12, FOLATE    The 10-year ASCVD risk score (Arnett DK, et al., 2019) is: 12.2%    Assessment & Plan:  Assessment and Plan Assessment & Plan Carpal Tunnel Syndrome   She experiences numbness in her hands and arms during activities like crushing or peeling vegetables, affecting the entire hand, including the pinky, and occurs bilaterally. Although right-handed, symptoms suggest carpal tunnel syndrome, but congenital neck fusion may contribute. Relief with a splint would confirm carpal tunnel syndrome; otherwise, cervical involvement is possible. Recommend purchasing a carpal tunnel splint from the pharmacy to assess symptom relief. If symptoms persist, further evaluation of the cervical spine may be necessary.  Diarrhea   Intermittent diarrhea is attributed to dietary habits. She has IBS, and a previous colonoscopy revealed one diverticulum. Diarrhea is not consistently  related to specific foods and can occur multiple times daily. Referral to Dr. Nandigam, gastroenterologist,  for further evaluation is warranted.  Hyperlipidemia   Elevated cholesterol levels were noted during last year's physical, with a heart risk score of nearly 13, indicating high risk for cardiovascular events. Plan to recheck cholesterol levels today and consider initiating low-dose medication if levels remain elevated. Family history is a contributing factor.  General Health Maintenance   Mammogram and flu vaccination are current. She is due for a colonoscopy next month. Advised to maintain adequate vitamin D levels and engage in weight-bearing exercises for bone health. Regular dental and eye check-ups are confirmed. Proceed with scheduled colonoscopy next month. Ensure adequate vitamin D intake and engage in weight-bearing exercises.   Problem List Items Addressed This Visit       Unprioritized   Preventative health care - Primary   Ghm utd Check labs  See AVS  Health Maintenance  Topic Date Due   COVID-19 Vaccine (1 - 2024-25 season) Never done   Colonoscopy  06/11/2024   INFLUENZA VACCINE  05/24/2024   DTaP/Tdap/Td (2 - Td or Tdap) 02/05/2025   Medicare Annual Wellness (AWV)  04/24/2025   MAMMOGRAM  04/25/2025   DEXA SCAN  04/25/2026   Pneumococcal Vaccine: 50+ Years  Completed   Hepatitis C Screening  Completed   Zoster Vaccines- Shingrix  Completed   Hepatitis B Vaccines  Aged Out   HPV VACCINES  Aged Out   Meningococcal B Vaccine  Aged Out         Osteopenia   Con't vita d3 and ca rich diet  Weight bearing exercise       OSA (obstructive sleep apnea)   On cpap Per neuro      Hyperlipidemia LDL goal <100   Encourage heart healthy diet such as MIND or DASH diet, increase exercise, avoid trans fats, simple carbohydrates and processed foods, consider a krill or fish or flaxseed oil cap daily.        Elevated BP without diagnosis of hypertension   Well controlled, no changes to meds. Encouraged heart healthy diet such as the DASH diet and exercise as tolerated.         Relevant Orders   CBC with Differential/Platelet   Comprehensive metabolic panel with GFR   Lipid panel   Other Visit Diagnoses       Hyperlipidemia, unspecified hyperlipidemia type       Relevant Orders   Comprehensive metabolic panel with GFR   Lipid panel     Diarrhea, unspecified type       Relevant Orders   Thyroid  Panel With TSH   Ambulatory referral to Gastroenterology     Assessment and Plan Assessment & Plan     Return if symptoms worsen or fail to improve.    Danyell Awbrey R Lowne Chase, DO

## 2024-05-16 NOTE — Assessment & Plan Note (Signed)
 Encourage heart healthy diet such as MIND or DASH diet, increase exercise, avoid trans fats, simple carbohydrates and processed foods, consider a krill or fish or flaxseed oil cap daily.

## 2024-05-16 NOTE — Assessment & Plan Note (Signed)
 Con't vita d3 and ca rich diet  Weight bearing exercise

## 2024-05-17 LAB — THYROID PANEL WITH TSH
Free Thyroxine Index: 2.5 (ref 1.4–3.8)
T3 Uptake: 30 % (ref 22–35)
T4, Total: 8.3 ug/dL (ref 5.1–11.9)
TSH: 2.5 m[IU]/L (ref 0.40–4.50)

## 2024-05-19 ENCOUNTER — Other Ambulatory Visit: Payer: Self-pay | Admitting: Family Medicine

## 2024-05-19 ENCOUNTER — Ambulatory Visit: Payer: Self-pay | Admitting: Family Medicine

## 2024-05-19 DIAGNOSIS — E785 Hyperlipidemia, unspecified: Secondary | ICD-10-CM

## 2024-05-19 DIAGNOSIS — R030 Elevated blood-pressure reading, without diagnosis of hypertension: Secondary | ICD-10-CM

## 2024-05-20 NOTE — Progress Notes (Unsigned)
 PATIENT: Brianna Farrell DOB: 09/24/1951  REASON FOR VISIT: follow up HISTORY FROM: patient  No chief complaint on file.    HISTORY OF PRESENT ILLNESS:  05/20/24 ALL:  Brianna Farrell returns for follow up for OSA on CPAP.   08/03/2023 ALL:  Brianna Farrell returns for follow up for OSA on CPAP. She was last seen 10/2021 and doing well. ONO suggested need for supplemental O2 but required CPAP titration. CPAP titration did not show need for O2. Since, she reports having difficulty tolerating CPAP. She was diagnosed with Covid last year and has had a hard time tolerating CPAP since. She reports dry mouth and a tickle cough when using therapy. She has very little data available but review of 15 days usage in 11/2022 showed AHI well managed at 0.7/h. No significant leak noted, although she reports having a leak in her mask at home. Using FFM. She does feel better rested when using CPAP.   11/11/2021 ALL:  Brianna Farrell is a 73 y.o. female here today for follow up for OSA on CPAP.  She was seen in consult with Dr Chalice 07/2021 for snoring and daytime sleepiness. HST 08/23/2021 showed OSA with AHI 22.1/hr with hypoxemia. AutoPAP ordered. She is doing well. She is tolerating CPAP. She feels that she is resting better and waking more refreshed. She had ONO two nights ago and awaiting results. She is using dreamwear FFM. She feels it fits most of the time but may need to be adjusted.     HISTORY: (copied from Dr Dohmeier's previous note)  Brianna Farrell is a 73 year- old Caucasian patient seen here as a referral  by PCP on 08/12/2021  Chief concern according to patient :  I can easily go to sleep, when ever not active and not stimulated. My late husband had sleep apnea and I never thought I would have it, but now I snore- my daughter told me and have woken up by my own snoring, but I am tired and my sleep is not refreshing, I wake with a dry mouth- I never was  a morning person.    Brianna Farrell  has a past medical  history of Anemia, Arthritis, Chicken pox, GERD (gastroesophageal reflux disease), History of stomach ulcers, HTN (hypertension), Hyperlipidemia, IBS (irritable bowel syndrome), Macular degeneration, Migraines, Pneumonia, and Urine incontinence.   Sleep relevant medical history: Nocturia one time-, Tonsillectomy at age 89,  Cervical spine- congential fusion , cant sleep flat on my back.     Family medical /sleep history: 2 living brothers, no other family member on CPAP with OSA. Chronic migraine in  daughter with insomnia.    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 In his last years he was demented and wandering.  She had been working as Heritage manager, Recruitment consultant, Nutritional therapist,  and lives in a household alone, one dog.  Tobacco [id number]  .  ETOH use none ,  Caffeine intake in form of Coffee( /) Soda( Dr Nunzio 1-2 a day) Tea ( /) and no energy drinks. Regular exercise in form of walking, 30 minutes a day.    Sleep habits are as follows: The patient's dinner time is between 6-8 PM. The patient goes to bed at 10-12 PM , but she had some night up to 4 AM., averages 6 hours of sleep. Her dog will wake her to be let outside.    The  preferred sleep position is in a recliner , and she tends to turn to the right side. with the support of a crumbled comforter instead of  pillows. Recliner sleeper due to back problems, hasn't slept in a bed in a long time, recliner in the den , not the bedroom.  Has GERD.  The TV is on a lot.  Plays games on the phone when she cant sleep Dreams are reportedly frequent.  9-10  AM is the usual rise time. The patient wakes up spontaneously hours before.   She reports not feeling refreshed or restored in AM, with symptoms such as dry mouth, pressure morning headaches, sinus headaches and residual fatigue.  Naps are taken infrequently, lasting from 1 to 2 hours- and are more  refreshing  than nocturnal sleep.    REVIEW OF SYSTEMS: Out of a complete 14 system review of symptoms, the patient complains only of the following symptoms, none and all other reviewed systems are negative.  ESS: 11/24, previously 10  ALLERGIES: Allergies  Allergen Reactions   Ibuprofen Anaphylaxis    REACTION: hives, tongue edema   Nsaids Anaphylaxis    REACTION: hives, tongue edema    HOME MEDICATIONS: No outpatient medications prior to visit.   No facility-administered medications prior to visit.    PAST MEDICAL HISTORY: Past Medical History:  Diagnosis Date   Anemia    Arthritis    Cataract    Chicken pox    GERD (gastroesophageal reflux disease)    History of stomach ulcers    HTN (hypertension)    Hyperlipidemia    IBS (irritable bowel syndrome)    Macular degeneration    Migraines    Pneumonia    Urine incontinence     PAST SURGICAL HISTORY: Past Surgical History:  Procedure Laterality Date   ABDOMINAL HYSTERECTOMY     Still has Ovaries   APPENDECTOMY  10/25/1983   DILATION AND CURETTAGE OF UTERUS  10/24/1982   EYE SURGERY     SHOULDER SURGERY Right    Shoulder Manipulation   TONSILLECTOMY AND ADENOIDECTOMY  10/25/1951    FAMILY HISTORY: Family History  Problem Relation Age of Onset   Arthritis Mother    Osteoarthritis Mother    Rheum arthritis Mother    Kidney disease Mother    Hyperlipidemia Father    Heart disease Father    Hyperlipidemia Brother    Heart disease Brother    Breast cancer Maternal Aunt    Uterine cancer Maternal Aunt    Stroke Paternal Aunt    Breast cancer Paternal Aunt    Ovarian cancer Cousin        x 2   Stomach cancer Neg Hx     SOCIAL HISTORY: Social History   Socioeconomic History   Marital status: Widowed    Spouse name: Not on file   Number of children: 1   Years of education: Not on file   Highest education level: 12th grade  Occupational History   Occupation: retired   Occupation: retired  Tobacco Use    Smoking status: Never   Smokeless tobacco: Never  Vaping Use   Vaping status: Never Used  Substance and Sexual Activity   Alcohol use: No   Drug use: No   Sexual activity: Not on file  Other Topics Concern   Not on file  Social History Narrative   Lives alone   Right handed   Caffeine: 20 oz of dr. Nunzio, no coffee   Social Drivers of Health  Financial Resource Strain: Low Risk  (05/09/2024)   Overall Financial Resource Strain (CARDIA)    Difficulty of Paying Living Expenses: Not very hard  Food Insecurity: No Food Insecurity (05/09/2024)   Hunger Vital Sign    Worried About Running Out of Food in the Last Year: Never true    Ran Out of Food in the Last Year: Never true  Transportation Needs: No Transportation Needs (05/09/2024)   PRAPARE - Administrator, Civil Service (Medical): No    Lack of Transportation (Non-Medical): No  Physical Activity: Inactive (05/09/2024)   Exercise Vital Sign    Days of Exercise per Week: 0 days    Minutes of Exercise per Session: Not on file  Stress: No Stress Concern Present (05/09/2024)   [employer] of Occupational Health - Occupational Stress Questionnaire    Feeling of Stress: Not at all  Social Connections: Moderately Integrated (05/09/2024)   Social Connection and Isolation Panel    Frequency of Communication with Friends and Family: More than three times a week    Frequency of Social Gatherings with Friends and Family: More than three times a week    Attends Religious Services: More than 4 times per year    Active Member of [employer] or Organizations: Yes    Attends Banker Meetings: More than 4 times per year    Marital Status: Widowed  Intimate Partner Violence: Not At Risk (04/24/2024)   Humiliation, Afraid, Rape, and Kick questionnaire    Fear of Current or Ex-Partner: No    Emotionally Abused: No    Physically Abused: No    Sexually Abused: No     PHYSICAL EXAM  There were no vitals filed for this  visit.   There is no height or weight on file to calculate BMI.  Generalized: Well developed, in no acute distress  Cardiology: normal rate and rhythm, no murmur noted Respiratory: clear to auscultation bilaterally  Neurological examination  Mentation: Alert oriented to time, place, history taking. Follows all commands speech and language fluent Cranial nerve II-XII: Pupils were equal round reactive to light. Extraocular movements were full, visual field were full  Motor: The motor testing reveals 5 over 5 strength of all 4 extremities. Good symmetric motor tone is noted throughout.  Gait and station: Gait is normal.    DIAGNOSTIC DATA (LABS, IMAGING, TESTING) - I reviewed patient records, labs, notes, testing and imaging myself where available.     09/27/2016    4:07 PM  MMSE - Mini Mental State Exam  Orientation to time 5   Orientation to Place 5   Registration 3   Attention/ Calculation 5   Recall 3   Language- name 2 objects 2   Language- repeat 1  Language- follow 3 step command 3   Language- read & follow direction 1   Write a sentence 1   Copy design 1   Total score 30      Data saved with a previous flowsheet row definition     Lab Results  Component Value Date   WBC 5.5 05/16/2024   HGB 14.9 05/16/2024   HCT 44.4 05/16/2024   MCV 88.9 05/16/2024   PLT 320.0 05/16/2024      Component Value Date/Time   NA 143 05/16/2024 1131   K 4.7 05/16/2024 1131   CL 105 05/16/2024 1131   CO2 31 05/16/2024 1131   GLUCOSE 103 (H) 05/16/2024 1131   BUN 9 05/16/2024 1131   CREATININE 1.18  05/16/2024 1131   CALCIUM  9.8 05/16/2024 1131   PROT 7.2 05/16/2024 1131   ALBUMIN 4.6 05/16/2024 1131   AST 22 05/16/2024 1131   ALT 18 05/16/2024 1131   ALKPHOS 65 05/16/2024 1131   BILITOT 1.6 (H) 05/16/2024 1131   Lab Results  Component Value Date   CHOL 216 (H) 05/16/2024   HDL 45.60 05/16/2024   LDLCALC 145 (H) 05/16/2024   LDLDIRECT 136.0 04/24/2023   TRIG 127.0  05/16/2024   CHOLHDL 5 05/16/2024   No results found for: HGBA1C No results found for: VITAMINB12 Lab Results  Component Value Date   TSH 2.50 05/16/2024     ASSESSMENT AND PLAN 73 y.o. year old female  has a past medical history of Anemia, Arthritis, Cataract, Chicken pox, GERD (gastroesophageal reflux disease), History of stomach ulcers, HTN (hypertension), Hyperlipidemia, IBS (irritable bowel syndrome), Macular degeneration, Migraines, Pneumonia, and Urine incontinence. here with   No diagnosis found.   Karema Furuya is doing well on CPAP therapy. Compliance report reveals very little usage over the past year. I have encouraged her to resume nightly use of CPAP. She will continue to monitor for leak. No significant leak noted on compliance report. I will send supply orders and ask DME to assess for correct mask seal. Consider mask refitting if needed. May use Biotene products. Risks of untreated sleep apnea review and education materials provided. Healthy lifestyle habits encouraged. She will follow up in 6 months, sooner if needed. She verbalizes understanding and agreement with this plan.    No orders of the defined types were placed in this encounter.    No orders of the defined types were placed in this encounter.     Greig Forbes, FNP-C 05/20/2024, 4:24 PM [facility] [address], [address] [place], [place] [address] [phone]

## 2024-05-20 NOTE — Patient Instructions (Signed)

## 2024-05-21 ENCOUNTER — Encounter: Payer: Self-pay | Admitting: Family Medicine

## 2024-05-21 ENCOUNTER — Ambulatory Visit (INDEPENDENT_AMBULATORY_CARE_PROVIDER_SITE_OTHER): Admitting: Family Medicine

## 2024-05-21 VITALS — BP 129/79 | HR 78 | Ht 63.0 in | Wt 168.5 lb

## 2024-05-21 DIAGNOSIS — G4733 Obstructive sleep apnea (adult) (pediatric): Secondary | ICD-10-CM

## 2024-05-21 DIAGNOSIS — K649 Unspecified hemorrhoids: Secondary | ICD-10-CM | POA: Insufficient documentation

## 2024-05-23 DIAGNOSIS — M8580 Other specified disorders of bone density and structure, unspecified site: Secondary | ICD-10-CM | POA: Diagnosis not present

## 2024-07-25 ENCOUNTER — Ambulatory Visit: Admitting: Gastroenterology

## 2024-07-25 ENCOUNTER — Encounter: Payer: Self-pay | Admitting: Gastroenterology

## 2024-07-25 VITALS — BP 110/74 | HR 67 | Ht 63.0 in | Wt 170.4 lb

## 2024-07-25 DIAGNOSIS — Z1211 Encounter for screening for malignant neoplasm of colon: Secondary | ICD-10-CM

## 2024-07-25 DIAGNOSIS — R1310 Dysphagia, unspecified: Secondary | ICD-10-CM

## 2024-07-25 DIAGNOSIS — K529 Noninfective gastroenteritis and colitis, unspecified: Secondary | ICD-10-CM | POA: Diagnosis not present

## 2024-07-25 MED ORDER — NA SULFATE-K SULFATE-MG SULF 17.5-3.13-1.6 GM/177ML PO SOLN: 1.0000 | Freq: Once | ORAL | 0 refills | Status: AC

## 2024-07-25 NOTE — Patient Instructions (Signed)
 You have been scheduled for an endoscopy and colonoscopy. Please follow the written instructions given to you at your visit today.  If you use inhalers (even only as needed), please bring them with you on the day of your procedure.  DO NOT TAKE 7 DAYS PRIOR TO TEST- Trulicity (dulaglutide) Ozempic, Wegovy (semaglutide) Mounjaro (tirzepatide) Bydureon Bcise (exanatide extended release)  DO NOT TAKE 1 DAY PRIOR TO YOUR TEST Rybelsus (semaglutide) Adlyxin (lixisenatide) Victoza (liraglutide) Byetta (exanatide) ________________________________________________________________________

## 2024-07-25 NOTE — Progress Notes (Signed)
 07/25/2024 Delayne Mittelstaedt [id number] 01-Jun-1951   HISTORY OF PRESENT ILLNESS: This is a 73 year old female who is here today discuss issues with diarrhea.  She was previously Dr. Obie patient and then a Dr. Eda patient.  Her care is being assigned to Dr. Nandigam as she prefers a female.  She has listed in her past medical history that she has IBS.  She otherwise has very limited past medical history, is not on any medications.  Does not take supplements/vitamins, etc.  She tells me that she has had diarrhea for a long time, but it is seems to be getting worse.  Says she does not leave the house without Imodium.  Said this week has been really rough.  She denies seeing blood in her stool except for very occasional bright red blood from hemorrhoids.  Reports intermittent abdominal pains.  Pain is not always in the same location, comes and goes.  Not necessarily relieved by bowel movements.  She has not made any significant changes in her diet.  Has not been on any recent antibiotics.  Once again does not take any medications.  She still has her gallbladder intact.  She does not use artificial sweeteners.  Colonoscopy August 2015 showed only diverticulosis in the sigmoid colon.  She also says that she has been experiencing food getting stuck when she swallows.  Said this has also been occurring for years, but she has just dealt with it.  Rare heartburn or reflux symptoms.   Past Medical History:  Diagnosis Date   Anemia    Arthritis    Cataract    Chicken pox    GERD (gastroesophageal reflux disease)    History of stomach ulcers    HTN (hypertension)    Hyperlipidemia    IBS (irritable bowel syndrome)    Macular degeneration    Migraines    Pneumonia    Urine incontinence    Past Surgical History:  Procedure Laterality Date   ABDOMINAL HYSTERECTOMY     Still has Ovaries   APPENDECTOMY  10/25/1983   BASAL CELL CARCINOMA EXCISION Right 2025   DILATION AND CURETTAGE OF UTERUS   10/24/1982   EYE SURGERY     SHOULDER SURGERY Right    Shoulder Manipulation   TONSILLECTOMY AND ADENOIDECTOMY  10/25/1951    reports that she has never smoked. She has never used smokeless tobacco. She reports that she does not drink alcohol and does not use drugs. family history includes Arthritis in her mother; Breast cancer in her maternal aunt and paternal aunt; Heart disease in her brother and father; Hyperlipidemia in her brother and father; Kidney disease in her mother; Osteoarthritis in her mother; Ovarian cancer in her cousin; Rheum arthritis in her mother; Stroke in her paternal aunt; Uterine cancer in her maternal aunt. Allergies  Allergen Reactions   Ibuprofen Anaphylaxis    REACTION: hives, tongue edema   Nsaids Anaphylaxis    REACTION: hives, tongue edema      No outpatient encounter medications on file as of 07/25/2024.   No facility-administered encounter medications on file as of 07/25/2024.    REVIEW OF SYSTEMS  : All other systems reviewed and negative except where noted in the History of Present Illness.   PHYSICAL EXAM: BP 110/74   Pulse 67   Ht 5' 3 (1.6 m)   Wt 170 lb 6 oz (77.3 kg)   BMI 30.18 kg/m  General: Well developed white female in no acute distress Head: Normocephalic and  atraumatic Eyes:  Sclerae anicteric, conjunctiva pink. Ears: Normal auditory acuity Lungs: Clear throughout to auscultation; no W/R/R. Heart: Regular rate and rhythm; no M/R/G. Abdomen: Soft, non-distended.  BS present.  Minimal diffuse TTP in the lower abdomen. Rectal:  Will be done at the time of colonoscopy. Musculoskeletal: Symmetrical with no gross deformities  Skin: No lesions on visible extremities Extremities: No edema  Neurological: Alert oriented x 4, grossly non-focal Psychological:  Alert and cooperative. Normal mood and affect  ASSESSMENT AND PLAN: *Diarrhea: Longstanding intermittent diarrhea, becoming more frequent.  Uses Imodium.  She is due for  colonoscopy for 10-year recall so we will start with that and will ask that physician to rule out microscopic colitis with random biopsies.  This is being scheduled with Dr. Nandigam as patient prefers a female.  If that is negative then we will consider pancreatic fecal elastase stool study, celiac labs, etc.  Not on any medications, has not made any changes to her diet, does not consume artificial sweeteners, etc. *Colon cancer screening: Last colonoscopy August 2015 with a repeat recommended in 10 years. *Dysphagia: Has also been present for years, describes feeling food getting stuck when swallowing.  She says that she is always just dealt with it.  No heartburn or reflux symptoms.  Will plan for EGD with possible dilation as well.  **The risks, benefits, and alternatives to EGD with dilation and colonoscopy were discussed with the patient and she consents to proceed.   CC:  Antonio Cyndee Jamee JONELLE, *

## 2024-07-30 DIAGNOSIS — D485 Neoplasm of uncertain behavior of skin: Secondary | ICD-10-CM | POA: Diagnosis not present

## 2024-07-30 DIAGNOSIS — L821 Other seborrheic keratosis: Secondary | ICD-10-CM | POA: Diagnosis not present

## 2024-07-30 DIAGNOSIS — L570 Actinic keratosis: Secondary | ICD-10-CM | POA: Diagnosis not present

## 2024-07-30 DIAGNOSIS — C44519 Basal cell carcinoma of skin of other part of trunk: Secondary | ICD-10-CM | POA: Diagnosis not present

## 2024-08-12 DIAGNOSIS — C44519 Basal cell carcinoma of skin of other part of trunk: Secondary | ICD-10-CM | POA: Diagnosis not present

## 2024-09-02 ENCOUNTER — Encounter: Admitting: Gastroenterology

## 2024-09-09 ENCOUNTER — Ambulatory Visit (AMBULATORY_SURGERY_CENTER): Admitting: Gastroenterology

## 2024-09-09 ENCOUNTER — Encounter: Payer: Self-pay | Admitting: Gastroenterology

## 2024-09-09 VITALS — BP 115/67 | HR 75 | Temp 96.8°F | Resp 14 | Ht 63.0 in | Wt 170.0 lb

## 2024-09-09 DIAGNOSIS — G4730 Sleep apnea, unspecified: Secondary | ICD-10-CM | POA: Diagnosis not present

## 2024-09-09 DIAGNOSIS — R1310 Dysphagia, unspecified: Secondary | ICD-10-CM | POA: Diagnosis not present

## 2024-09-09 DIAGNOSIS — K2970 Gastritis, unspecified, without bleeding: Secondary | ICD-10-CM

## 2024-09-09 DIAGNOSIS — K222 Esophageal obstruction: Secondary | ICD-10-CM | POA: Diagnosis not present

## 2024-09-09 DIAGNOSIS — K449 Diaphragmatic hernia without obstruction or gangrene: Secondary | ICD-10-CM | POA: Diagnosis not present

## 2024-09-09 DIAGNOSIS — K2289 Other specified disease of esophagus: Secondary | ICD-10-CM | POA: Diagnosis not present

## 2024-09-09 DIAGNOSIS — K3189 Other diseases of stomach and duodenum: Secondary | ICD-10-CM | POA: Diagnosis not present

## 2024-09-09 DIAGNOSIS — K644 Residual hemorrhoidal skin tags: Secondary | ICD-10-CM

## 2024-09-09 DIAGNOSIS — I10 Essential (primary) hypertension: Secondary | ICD-10-CM | POA: Diagnosis not present

## 2024-09-09 DIAGNOSIS — D125 Benign neoplasm of sigmoid colon: Secondary | ICD-10-CM | POA: Diagnosis not present

## 2024-09-09 DIAGNOSIS — K529 Noninfective gastroenteritis and colitis, unspecified: Secondary | ICD-10-CM | POA: Diagnosis not present

## 2024-09-09 DIAGNOSIS — K648 Other hemorrhoids: Secondary | ICD-10-CM

## 2024-09-09 DIAGNOSIS — K5730 Diverticulosis of large intestine without perforation or abscess without bleeding: Secondary | ICD-10-CM | POA: Diagnosis not present

## 2024-09-09 DIAGNOSIS — K2081 Other esophagitis with bleeding: Secondary | ICD-10-CM | POA: Diagnosis not present

## 2024-09-09 MED ORDER — SODIUM CHLORIDE 0.9 % IV SOLN: 500.0000 mL | Freq: Once | INTRAVENOUS | Status: DC

## 2024-09-09 MED ORDER — PANTOPRAZOLE SODIUM 40 MG PO TBEC: 40.0000 mg | DELAYED_RELEASE_TABLET | Freq: Every day | ORAL | 3 refills | Status: DC

## 2024-09-09 NOTE — Progress Notes (Unsigned)
1337 Robinul 0.1 mg IV given due large amount of secretions upon assessment.  MD made aware, vss

## 2024-09-09 NOTE — Progress Notes (Unsigned)
 1355 Nasopharyngeal airway size 7.0  placed without trauma due to severe OSA,  vss

## 2024-09-09 NOTE — Progress Notes (Unsigned)
Updated medical record with pt

## 2024-09-09 NOTE — Op Note (Signed)
 [facility] Patient Name: Brianna Farrell Procedure Date: 09/09/2024 1:35 PM MRN: [id number] Endoscopist: Gustav ALONSO Mcgee , MD, [id number] Age: 73 Referring MD:  Date of Birth: 02-09-1951 Gender: Female Account #: [id number] Procedure:                Colonoscopy Indications:              Clinically significant diarrhea of unexplained                            origin Medicines:                Monitored Anesthesia Care Procedure:                Pre-Anesthesia Assessment:                           - Prior to the procedure, a History and Physical                            was performed, and patient medications and                            allergies were reviewed. The patient's tolerance of                            previous anesthesia was also reviewed. The risks                            and benefits of the procedure and the sedation                            options and risks were discussed with the patient.                            All questions were answered, and informed consent                            was obtained. Prior Anticoagulants: The patient has                            taken no anticoagulant or antiplatelet agents. ASA                            Grade Assessment: II - A patient with mild systemic                            disease. After reviewing the risks and benefits,                            the patient was deemed in satisfactory condition to                            undergo the procedure.  After obtaining informed consent, the colonoscope                            was passed under direct vision. Throughout the                            procedure, the patient's blood pressure, pulse, and                            oxygen  saturations were monitored continuously. The                            Olympus Scope [id number] was introduced through the                            anus and advanced to the the cecum, identified by                             appendiceal orifice and ileocecal valve. The                            colonoscopy was performed without difficulty. The                            patient tolerated the procedure well. The quality                            of the bowel preparation was good. The ileocecal                            valve, appendiceal orifice, and rectum were                            photographed. Scope In: 2:02:58 PM Scope Out: 2:17:27 PM Scope Withdrawal Time: 0 hours 9 minutes 9 seconds  Total Procedure Duration: 0 hours 14 minutes 29 seconds  Findings:                 The perianal and digital rectal examinations were                            normal.                           A 7 mm polyp was found in the sigmoid colon. The                            polyp was sessile. The polyp was removed with a                            cold snare. Resection and retrieval were complete.                           Normal mucosa was found in the entire colon.  Biopsies for histology were taken with a cold                            forceps from the right colon and left colon for                            evaluation of microscopic colitis.                           Scattered small-mouthed diverticula were found in                            the sigmoid colon.                           Non-bleeding external and internal hemorrhoids were                            found during retroflexion. The hemorrhoids were                            medium-sized. Complications:            No immediate complications. Estimated Blood Loss:     Estimated blood loss was minimal. Impression:               - One 7 mm polyp in the sigmoid colon, removed with                            a cold snare. Resected and retrieved.                           - Normal mucosa in the entire examined colon.                            Biopsied.                           - Diverticulosis in the  sigmoid colon.                           - Non-bleeding external and internal hemorrhoids. Recommendation:           - Resume previous diet.                           - Continue present medications.                           - Await pathology results.                           - No repeat colonoscopy due to age. Davia Smyre V. Kymberly Blomberg, MD 09/09/2024 2:28:01 PM This report has been signed electronically.

## 2024-09-09 NOTE — Progress Notes (Unsigned)
 [facility] History and Physical   Primary Care Physician:  Antonio Cyndee Jamee JONELLE, DO   Reason for Procedure:  Dysphagia, chronic diarrhea  Plan:    EGD and colonoscopy with possible interventions as needed     HPI: Brianna Farrell is a very pleasant 73 y.o. female here for EGD and colonoscopy for dysphagia and chronic diarrhea.   The risks and benefits as well as alternatives of endoscopic procedure(s) have been discussed and reviewed.  The patient was provided an opportunity to ask questions and all were answered. The patient agreed with the plan and demonstrated an understanding of the instructions.   Past Medical History:  Diagnosis Date   Allergy    seasonal   Anemia    Arthritis    Cancer (HCC)    basal cell   Cataract    bilateral removed   Chicken pox    GERD (gastroesophageal reflux disease)    History of stomach ulcers    HTN (hypertension)    Hyperlipidemia    IBS (irritable bowel syndrome)    Macular degeneration    Migraines    Pneumonia    Sleep apnea    cpap   Urine incontinence     Past Surgical History:  Procedure Laterality Date   ABDOMINAL HYSTERECTOMY     Still has Ovaries   APPENDECTOMY  10/25/1983   BASAL CELL CARCINOMA EXCISION Right 2025   BUNIONECTOMY Right    cataracts Bilateral    COLONOSCOPY  2015   normal   DILATION AND CURETTAGE OF UTERUS  10/24/1982   EYE SURGERY Bilateral 2004   retinal tears   SHOULDER SURGERY Right    Shoulder Manipulation   TONSILLECTOMY AND ADENOIDECTOMY  10/25/1951    Prior to Admission medications   Medication Sig Start Date End Date Taking? Authorizing Provider  loperamide (IMODIUM A-D) 2 MG tablet Take 2 mg by mouth 4 (four) times daily as needed for diarrhea or loose stools.   Yes [provider]    Current Outpatient Medications  Medication Sig Dispense Refill   loperamide (IMODIUM A-D) 2 MG tablet Take 2 mg by mouth 4 (four) times daily as needed for diarrhea or loose  stools.     Current Facility-Administered Medications  Medication Dose Route Frequency Provider Last Rate Last Admin   0.9 %  sodium chloride  infusion  500 mL Intravenous Once Rayelynn Loyal V, MD        Allergies as of 09/09/2024 - Review Complete 09/09/2024  Allergen Reaction Noted   Ibuprofen Anaphylaxis    Nsaids Anaphylaxis     Family History  Problem Relation Age of Onset   Arthritis Mother    Osteoarthritis Mother    Rheum arthritis Mother    Kidney disease Mother    Hyperlipidemia Father    Heart disease Father    Hyperlipidemia Brother    Heart disease Brother    Breast cancer Maternal Aunt    Uterine cancer Maternal Aunt    Stroke Paternal Aunt    Breast cancer Paternal Aunt    Ovarian cancer Cousin        x 2   Stomach cancer Other    Rectal cancer Other    Colon cancer Neg Hx    Colon polyps Neg Hx    Esophageal cancer Neg Hx     Social History   Socioeconomic History   Marital status: Widowed    Spouse name: Not on file   Number of children: 1  Years of education: Not on file   Highest education level: 12th grade  Occupational History   Occupation: retired   Occupation: retired  Tobacco Use   Smoking status: Never    Passive exposure: Past   Smokeless tobacco: Never  Vaping Use   Vaping status: Never Used  Substance and Sexual Activity   Alcohol use: Never   Drug use: Never   Sexual activity: Not Currently    Birth control/protection: Post-menopausal, Surgical  Other Topics Concern   Not on file  Social History Narrative   Lives alone   Right handed   Caffeine: 20 oz of dr. Nunzio, no coffee   Social Drivers of Health   Financial Resource Strain: Low Risk  (05/09/2024)   Overall Financial Resource Strain (CARDIA)    Difficulty of Paying Living Expenses: Not very hard  Food Insecurity: No Food Insecurity (05/09/2024)   Hunger Vital Sign    Worried About Running Out of Food in the Last Year: Never true    Ran Out of Food in the  Last Year: Never true  Transportation Needs: No Transportation Needs (05/09/2024)   PRAPARE - Administrator, Civil Service (Medical): No    Lack of Transportation (Non-Medical): No  Physical Activity: Inactive (05/09/2024)   Exercise Vital Sign    Days of Exercise per Week: 0 days    Minutes of Exercise per Session: Not on file  Stress: No Stress Concern Present (05/09/2024)   [employer] of Occupational Health - Occupational Stress Questionnaire    Feeling of Stress: Not at all  Social Connections: Moderately Integrated (05/09/2024)   Social Connection and Isolation Panel    Frequency of Communication with Friends and Family: More than three times a week    Frequency of Social Gatherings with Friends and Family: More than three times a week    Attends Religious Services: More than 4 times per year    Active Member of [employer] or Organizations: Yes    Attends Banker Meetings: More than 4 times per year    Marital Status: Widowed  Intimate Partner Violence: Not At Risk (04/24/2024)   Humiliation, Afraid, Rape, and Kick questionnaire    Fear of Current or Ex-Partner: No    Emotionally Abused: No    Physically Abused: No    Sexually Abused: No    Review of Systems:  All other review of systems negative except as mentioned in the HPI.  Physical Exam: Vital signs in last 24 hours: BP (!) 154/94   Pulse 71   Temp (!) 96.8 F (36 C) (Temporal)   Resp 11   Ht 5' 3 (1.6 m)   Wt 170 lb (77.1 kg)   SpO2 99%   BMI 30.11 kg/m  General:   Alert, NAD Lungs:  Clear .   Heart:  Regular rate and rhythm Abdomen:  Soft, nontender and nondistended. Neuro/Psych:  Alert and cooperative. Normal mood and affect. A and O x 3  Reviewed labs, radiology imaging, old records and pertinent past GI work up  Patient is appropriate for planned procedure(s) and anesthesia in an ambulatory setting   K. Veena Cameren Odwyer , MD [phone]

## 2024-09-09 NOTE — Op Note (Addendum)
 [facility] Patient Name: Brianna Farrell Procedure Date: 09/09/2024 1:36 PM MRN: [id number] Endoscopist: Gustav ALONSO Mcgee , MD, [id number] Age: 73 Referring MD:  Date of Birth: 24-Jul-1951 Gender: Female Account #: [id number] Procedure:                Upper GI endoscopy Indications:              Dysphagia Medicines:                Monitored Anesthesia Care Procedure:                Pre-Anesthesia Assessment:                           - Prior to the procedure, a History and Physical                            was performed, and patient medications and                            allergies were reviewed. The patient's tolerance of                            previous anesthesia was also reviewed. The risks                            and benefits of the procedure and the sedation                            options and risks were discussed with the patient.                            All questions were answered, and informed consent                            was obtained. Prior Anticoagulants: The patient has                            taken no anticoagulant or antiplatelet agents. ASA                            Grade Assessment: II - A patient with mild systemic                            disease. After reviewing the risks and benefits,                            the patient was deemed in satisfactory condition to                            undergo the procedure.                           After obtaining informed consent, the endoscope was  passed under direct vision. Throughout the                            procedure, the patient's blood pressure, pulse, and                            oxygen  saturations were monitored continuously. The                            GIF [id number] #[id number] was introduced through the                            mouth, and advanced to the second part of duodenum.                            The upper GI endoscopy was accomplished  without                            difficulty. The patient tolerated the procedure                            well. Scope In: Scope Out: Findings:                 One benign-appearing, intrinsic mild stenosis was                            found 39 to 40 cm from the incisors. This stenosis                            measured less than one cm (in length). The stenosis                            was traversed. A TTS dilator was passed through the                            scope. Dilation with an 18-19-20 mm x 8 cm CRE                            balloon dilator was performed to 20 mm. The                            dilation site was examined following endoscope                            reinsertion and showed mild mucosal disruption.                           LA Grade B (one or more mucosal breaks greater than                            5 mm, not extending between the tops of two mucosal  folds) esophagitis with no bleeding was found 38 to                            40 cm from the incisors. Biopsies were taken with a                            cold forceps for histology.                           A 2 cm hiatal hernia was present.                           Patchy mild inflammation characterized by                            congestion (edema), erosions, erythema and                            friability was found in the entire examined                            stomach. Biopsies were taken with a cold forceps                            for Helicobacter pylori testing.                           The cardia and gastric fundus were normal on                            retroflexion.                           The examined duodenum was normal. Complications:            No immediate complications. Estimated Blood Loss:     Estimated blood loss was minimal. Impression:               - Benign-appearing esophageal stenosis. Dilated.                           - LA Grade B  reflux esophagitis with no bleeding.                            Biopsied.                           - 2 cm hiatal hernia.                           - Gastritis. Biopsied.                           - Normal examined duodenum. Recommendation:           - Patient has a contact number available for  emergencies. The signs and symptoms of potential                            delayed complications were discussed with the                            patient. Return to normal activities tomorrow.                            Written discharge instructions were provided to the                            patient.                           - Resume previous diet.                           - Continue present medications.                           - Await pathology results.                           - Follow an antireflux regimen.                           - Use Protonix  (pantoprazole ) 40 mg PO daily. Rx                            for 90 days with 3 refills                           - Follow up in GI office with P[facility] APP in 2 months Gustav ALONSO Mcgee, MD 09/09/2024 2:32:01 PM This report has been signed electronically.

## 2024-09-09 NOTE — Patient Instructions (Addendum)
-  Await pathology results -Handout on polyps, hemorrhoids, diverticulosis, gastritis and esophagitis provided -Use Protonix  (pantoprazole ) 40 mg every day  YOU HAD AN ENDOSCOPIC PROCEDURE TODAY AT THE  ENDOSCOPY CENTER:   Refer to the procedure report that was given to you for any specific questions about what was found during the examination.  If the procedure report does not answer your questions, please call your gastroenterologist to clarify.  If you requested that your care partner not be given the details of your procedure findings, then the procedure report has been included in a sealed envelope for you to review at your convenience later.  YOU SHOULD EXPECT: Some feelings of bloating in the abdomen. Passage of more gas than usual.  Walking can help get rid of the air that was put into your GI tract during the procedure and reduce the bloating. If you had a lower endoscopy (such as a colonoscopy or flexible sigmoidoscopy) you may notice spotting of blood in your stool or on the toilet paper. If you underwent a bowel prep for your procedure, you may not have a normal bowel movement for a few days.  Please Note:  You might notice some irritation and congestion in your nose or some drainage.  This is from the oxygen  used during your procedure.  There is no need for concern and it should clear up in a day or so.  SYMPTOMS TO REPORT IMMEDIATELY:  Following lower endoscopy (colonoscopy or flexible sigmoidoscopy):  Excessive amounts of blood in the stool  Significant tenderness or worsening of abdominal pains  Swelling of the abdomen that is new, acute  Fever of 100F or higher  Following upper endoscopy (EGD)  Vomiting of blood or coffee ground material  New chest pain or pain under the shoulder blades  Painful or persistently difficult swallowing  New shortness of breath  Fever of 100F or higher  Black, tarry-looking stools  For urgent or emergent issues, a gastroenterologist can  be reached at any hour by calling [phone]. Do not use MyChart messaging for urgent concerns.    DIET:  We do recommend a small meal at first, but then you may proceed to your regular diet.  Drink plenty of fluids but you should avoid alcoholic beverages for 24 hours.  ACTIVITY:  You should plan to take it easy for the rest of today and you should NOT DRIVE or use heavy machinery until tomorrow (because of the sedation medicines used during the test).    FOLLOW UP: Our staff will call the number listed on your records the next business day following your procedure.  We will call around 7:15- 8:00 am to check on you and address any questions or concerns that you may have regarding the information given to you following your procedure. If we do not reach you, we will leave a message.     If any biopsies were taken you will be contacted by phone or by letter within the next 1-3 weeks.  Please call us  at [phone] if you have not heard about the biopsies in 3 weeks.    SIGNATURES/CONFIDENTIALITY: You and/or your care partner have signed paperwork which will be entered into your electronic medical record.  These signatures attest to the fact that that the information above on your After Visit Summary has been reviewed and is understood.  Full responsibility of the confidentiality of this discharge information lies with you and/or your care-partner.

## 2024-09-09 NOTE — Progress Notes (Unsigned)
 Report given to PACU, vss

## 2024-09-10 ENCOUNTER — Telehealth: Payer: Self-pay

## 2024-09-10 NOTE — Telephone Encounter (Signed)
  Follow up Call-     09/09/2024   12:48 PM  Call back number  Post procedure Call Back phone  # [phone]  Permission to leave phone message Yes     Patient questions:  Do you have a fever, pain , or abdominal swelling? No. Pain Score  0 *  Have you tolerated food without any problems? Yes.    Have you been able to return to your normal activities? Yes.    Do you have any questions about your discharge instructions: Diet   No. Medications  No. Follow up visit  No.  Do you have questions or concerns about your Care? No.  Actions: * If pain score is 4 or above: No action needed, pain <4.

## 2024-09-11 ENCOUNTER — Encounter: Payer: Self-pay | Admitting: Gastroenterology

## 2024-09-12 LAB — SURGICAL PATHOLOGY

## 2024-10-29 ENCOUNTER — Encounter: Payer: Self-pay | Admitting: Physician Assistant

## 2024-10-29 ENCOUNTER — Ambulatory Visit (INDEPENDENT_AMBULATORY_CARE_PROVIDER_SITE_OTHER): Admitting: Physician Assistant

## 2024-10-29 VITALS — BP 118/56 | HR 63 | Ht 63.0 in | Wt 170.5 lb

## 2024-10-29 DIAGNOSIS — K2970 Gastritis, unspecified, without bleeding: Secondary | ICD-10-CM

## 2024-10-29 DIAGNOSIS — K219 Gastro-esophageal reflux disease without esophagitis: Secondary | ICD-10-CM | POA: Diagnosis not present

## 2024-10-29 DIAGNOSIS — R197 Diarrhea, unspecified: Secondary | ICD-10-CM

## 2024-10-29 DIAGNOSIS — R1310 Dysphagia, unspecified: Secondary | ICD-10-CM

## 2024-10-29 MED ORDER — PANTOPRAZOLE SODIUM 40 MG PO TBEC: 40.0000 mg | DELAYED_RELEASE_TABLET | Freq: Every day | ORAL | 3 refills | Status: AC

## 2024-10-29 MED ORDER — LOPERAMIDE HCL 2 MG PO TABS: 2.0000 mg | ORAL_TABLET | Freq: Four times a day (QID) | ORAL | 2 refills | Status: AC | PRN

## 2024-10-29 NOTE — Patient Instructions (Signed)
 We have sent the following medications to your pharmacy for you to pick up at your convenience: Pantoprazole , Loperamide   Follow-up as needed.   _______________________________________________________  If your blood pressure at your visit was 140/90 or greater, please contact your primary care physician to follow up on this.  _______________________________________________________  If you are age 76 or older, your body mass index should be between 23-30. Your Body mass index is 30.2 kg/m. If this is out of the aforementioned range listed, please consider follow up with your Primary Care Provider.  If you are age 28 or younger, your body mass index should be between 19-25. Your Body mass index is 30.2 kg/m. If this is out of the aformentioned range listed, please consider follow up with your Primary Care Provider.   ________________________________________________________  [facility] providers would like to encourage you to use MYCHART to communicate with providers for non-urgent requests or questions.  Due to long hold times on the telephone, sending your provider a message by [facility] may be a faster and more efficient way to get a response.  Please allow 48 business hours for a response.  Please remember that this is for non-urgent requests.  _______________________________________________________  [facility] is using a team-based approach to care.  Your team is made up of your doctor and two to three APPS. Our APPS (Nurse Practitioners and Physician Assistants) work with your physician to ensure care continuity for you. They are fully qualified to address your health concerns and develop a treatment plan. They communicate directly with your gastroenterologist to care for you. Seeing the Advanced Practice Practitioners on your physician's team can help you by facilitating care more promptly, often allowing for earlier appointments, access to diagnostic testing, procedures,  and other specialty referrals.   Thank you for choosing me and [facility].  Delon Failing, PA-C

## 2024-10-29 NOTE — Progress Notes (Signed)
 "  Chief Complaint: Follow-up after recent EGD and colonoscopy  HPI:    Brianna Farrell is a 74 year old female with a past medical history as listed below including reflux and IBS, assigned to Dr. Shila, who presents to clinic today for follow-up after recent EGD and colonoscopy.    August 2015 colonoscopy with diverticulosis in the sigmoid colon.    07/25/2024 patient seen in clinic by Jessica Zehr, PA-C at that time discussed a history of IBS.  Noted diarrhea that was getting worse, cannot leave the house without Imodium .  No blood in her stool.  Occasional bright red blood from hemorrhoids.  Intermittent abdominal pain.  At that time also discussed some dysphagia.  At that point scheduled for colonoscopy and EGD with possible dilation.    09/09/2024 EGD with benign-appearing esophageal stenosis dilated LA grade B reflux esophagitis as well as a 2 cm hiatal hernia and gastritis.  Patient recommended to take Pantoprazole  40 mg p.o. daily.  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    09/09/2024 colonoscopy with a 7 mm polyp in the sigmoid colon, diverticulosis in the sigmoid colon nonbleeding external and internal hemorrhoids.  Pathology showed colonic mucosa with no specific changes.  Tubular adenoma.  No repeat recommended due to age.    Today, patient presents to clinic today for follow-up after recent EGD and colonoscopy.  She is feeling well and doing better using Pantoprazole  40 mg daily which she would like to stay on.  She uses this religiously since has been such a big help to her.  No further problems swallowing.  Continues to use Imodium  as needed for diarrhea.  Tells me that occasionally she will have a bad day where she will have 4-5 loose stools, she knows she has to go somewhere she will take an Imodium  and that works well for her.  She maybe  uses Imodium  6-8 times a month.  Very comfortable with this regimen.  No additional GI complaints today.    Denies fever, chills, weight loss, blood in her stool, nausea, vomiting or symptoms that awaken her from sleep.   Past Medical History:  Diagnosis Date   Allergy    seasonal   Anemia    Arthritis    Cancer (HCC)    basal cell   Cataract    bilateral removed   Chicken pox    GERD (gastroesophageal reflux disease)    History of stomach ulcers    HTN (hypertension)    Hyperlipidemia    IBS (irritable bowel syndrome)    Macular degeneration    Migraines    Pneumonia    Sleep apnea    cpap   Urine incontinence     Past Surgical History:  Procedure Laterality Date   ABDOMINAL HYSTERECTOMY     Still has Ovaries   APPENDECTOMY  10/25/1983   BASAL CELL CARCINOMA EXCISION Right 2025   BUNIONECTOMY Right    cataracts Bilateral    COLONOSCOPY  2015   normal   DILATION AND CURETTAGE OF UTERUS  10/24/1982   EYE SURGERY Bilateral 2004   retinal tears   SHOULDER SURGERY Right    Shoulder Manipulation   TONSILLECTOMY AND ADENOIDECTOMY  10/25/1951    Current Outpatient Medications  Medication Sig Dispense Refill   loperamide  (IMODIUM  A-D) 2 MG tablet Take 2 mg by mouth 4 (four) times daily as needed for diarrhea  or loose stools.     pantoprazole  (PROTONIX ) 40 MG tablet Take 1 tablet (40 mg total) by mouth daily. 90 tablet 3   No current facility-administered medications for this visit.    Allergies as of 10/29/2024 - Review Complete 09/11/2024  Allergen Reaction Noted   Ibuprofen Anaphylaxis    Nsaids Anaphylaxis     Family History  Problem Relation Age of Onset   Arthritis Mother    Osteoarthritis Mother    Rheum arthritis Mother    Kidney disease Mother    Hyperlipidemia Father    Heart disease Father    Hyperlipidemia Brother    Heart disease Brother    Breast cancer Maternal Aunt    Uterine cancer Maternal Aunt    Stroke Paternal Aunt    Breast cancer  Paternal Aunt    Ovarian cancer Cousin        x 2   Stomach cancer Other    Rectal cancer Other    Colon cancer Neg Hx    Colon polyps Neg Hx    Esophageal cancer Neg Hx     Social History   Socioeconomic History   Marital status: Widowed    Spouse name: Not on file   Number of children: 1   Years of education: Not on file   Highest education level: 12th grade  Occupational History   Occupation: retired   Occupation: retired  Tobacco Use   Smoking status: Never    Passive exposure: Past   Smokeless tobacco: Never  Vaping Use   Vaping status: Never Used  Substance and Sexual Activity   Alcohol use: Never   Drug use: Never   Sexual activity: Not Currently    Birth control/protection: Post-menopausal, Surgical  Other Topics Concern   Not on file  Social History Narrative   Lives alone   Right handed   Caffeine: 20 oz of dr. Nunzio, no coffee   Social Drivers of Health   Tobacco Use: Low Risk (09/11/2024)   Patient History    Smoking Tobacco Use: Never    Smokeless Tobacco Use: Never    Passive Exposure: Past  Financial Resource Strain: Low Risk (05/09/2024)   Overall Financial Resource Strain (CARDIA)    Difficulty of Paying Living Expenses: Not very hard  Food Insecurity: No Food Insecurity (05/09/2024)   Epic    Worried About Programme Researcher, Broadcasting/film/video in the Last Year: Never true    Ran Out of Food in the Last Year: Never true  Transportation Needs: No Transportation Needs (05/09/2024)   Epic    Lack of Transportation (Medical): No    Lack of Transportation (Non-Medical): No  Physical Activity: Inactive (05/09/2024)   Exercise Vital Sign    Days of Exercise per Week: 0 days    Minutes of Exercise per Session: Not on file  Stress: No Stress Concern Present (05/09/2024)   [employer] of Occupational Health - Occupational Stress Questionnaire    Feeling of Stress: Not at all  Social Connections: Moderately Integrated (05/09/2024)   Social Connection and  Isolation Panel    Frequency of Communication with Friends and Family: More than three times a week    Frequency of Social Gatherings with Friends and Family: More than three times a week    Attends Religious Services: More than 4 times per year    Active Member of [employer] or Organizations: Yes    Attends Banker Meetings: More than 4 times per year    Marital  Status: Widowed  Intimate Partner Violence: Not At Risk (04/24/2024)   Epic    Fear of Current or Ex-Partner: No    Emotionally Abused: No    Physically Abused: No    Sexually Abused: No  Depression (PHQ2-9): Low Risk (04/24/2024)   Depression (PHQ2-9)    PHQ-2 Score: 0  Alcohol Screen: Low Risk (04/24/2024)   Alcohol Screen    Last Alcohol Screening Score (AUDIT): 0  Housing: Unknown (05/09/2024)   Epic    Unable to Pay for Housing in the Last Year: No    Number of Times Moved in the Last Year: Not on file    Homeless in the Last Year: No  Utilities: Not At Risk (04/24/2024)   Epic    Threatened with loss of utilities: No  Health Literacy: Adequate Health Literacy (04/24/2024)   B1300 Health Literacy    Frequency of need for help with medical instructions: Never    Review of Systems:    Constitutional: No weight loss, fever or chills Cardiovascular: No chest pain Respiratory: No SOB  Gastrointestinal: See HPI and otherwise negative   Physical Exam:  Vital signs: BP (!) 118/56   Pulse 63   Ht 5' 3 (1.6 m)   Wt 170 lb 8 oz (77.3 kg)   BMI 30.20 kg/m    Constitutional:   Pleasant elderly Caucasian female appears to be in NAD, Well developed, Well nourished, alert and cooperative Respiratory: Respirations even and unlabored. Lungs clear to auscultation bilaterally.   No wheezes, crackles, or rhonchi.  Cardiovascular: Normal S1, S2. No MRG. Regular rate and rhythm. No peripheral edema, cyanosis or pallor.  Gastrointestinal:  Soft, nondistended, nontender. No rebound or guarding. Normal bowel sounds. No appreciable  masses or hepatomegaly. Rectal:  Not performed.  Psychiatric: Demonstrates good judgement and reason without abnormal affect or behaviors.  RELEVANT LABS AND IMAGING: CBC    Component Value Date/Time   WBC 5.5 05/16/2024 1131   RBC 4.99 05/16/2024 1131   HGB 14.9 05/16/2024 1131   HCT 44.4 05/16/2024 1131   PLT 320.0 05/16/2024 1131   MCV 88.9 05/16/2024 1131   MCHC 33.7 05/16/2024 1131   RDW 13.0 05/16/2024 1131   LYMPHSABS 2.6 05/16/2024 1131   MONOABS 0.4 05/16/2024 1131   EOSABS 0.2 05/16/2024 1131   BASOSABS 0.0 05/16/2024 1131    CMP     Component Value Date/Time   NA 143 05/16/2024 1131   K 4.7 05/16/2024 1131   CL 105 05/16/2024 1131   CO2 31 05/16/2024 1131   GLUCOSE 103 (H) 05/16/2024 1131   BUN 9 05/16/2024 1131   CREATININE 1.18 05/16/2024 1131   CALCIUM  9.8 05/16/2024 1131   PROT 7.2 05/16/2024 1131   ALBUMIN 4.6 05/16/2024 1131   AST 22 05/16/2024 1131   ALT 18 05/16/2024 1131   ALKPHOS 65 05/16/2024 1131   BILITOT 1.6 (H) 05/16/2024 1131    Assessment: 1.  GERD: With symptoms of dysphagia, recent stenosis dilated at time of EGD in November, signs of grade B reflux esophagitis and gastritis at the time, symptoms all improved on Pantoprazole  40 mg daily 2.  Diarrhea: Longstanding intermittent diarrhea, uses Imodium  as needed, recent colonoscopy in November with biopsies negative for microscopic colitis, likely IBS +/- gastritis as above certain foods seem to make this worse such as tomato based things, controlled well on Imodium  as needed  Plan: 1.  Reviewed recent EGD and colonoscopy.  Answered questions.  Patient would like to continue to use  Imodium  as needed, will try to send this in for her to see if it is any cheaper, sent in refills of Loperamide  2 mg daily as needed #90 with 3 refills 2.  Also really filled Pantoprazole  40 mg daily #90 with 3 refills. 4.  If patient is doing well on her current regimen in a year she can have another years worth of  refills before having a follow-up.  Brianna Failing, PA-C  Gastroenterology 10/29/2024, 9:17 AM  Cc: Brianna Cyndee Jamee JONELLE, DO  "

## 2025-04-29 ENCOUNTER — Ambulatory Visit

## 2025-05-19 ENCOUNTER — Ambulatory Visit: Admitting: Family Medicine

## 2025-05-21 ENCOUNTER — Ambulatory Visit: Admitting: Family Medicine
